# Patient Record
Sex: Female | Born: 2000 | Race: White | Hispanic: No | Marital: Single | Smoking: Current every day smoker
Health system: Southern US, Community
[De-identification: ages and names within clinical notes are randomized; demographics above are authoritative.]

## PROBLEM LIST (undated history)

## (undated) DIAGNOSIS — F419 Anxiety disorder, unspecified: Secondary | ICD-10-CM

## (undated) DIAGNOSIS — E282 Polycystic ovarian syndrome: Secondary | ICD-10-CM

## (undated) DIAGNOSIS — F32A Depression, unspecified: Secondary | ICD-10-CM

## (undated) DIAGNOSIS — G932 Benign intracranial hypertension: Secondary | ICD-10-CM

## (undated) HISTORY — PX: BRAIN SURGERY: SHX531

---

## 2019-06-05 ENCOUNTER — Emergency Department
Admission: EM | Admit: 2019-06-05 | Discharge: 2019-06-05 | Disposition: A | Discharge status: Home / Self Care | Payer: BC Managed Care – PPO | Attending: Emergency Medicine | Admitting: Emergency Medicine

## 2019-06-05 ENCOUNTER — Emergency Department: Payer: BC Managed Care – PPO

## 2019-06-05 DIAGNOSIS — T8501XA Breakdown (mechanical) of ventricular intracranial (communicating) shunt, initial encounter: Secondary | ICD-10-CM

## 2019-06-05 DIAGNOSIS — G932 Benign intracranial hypertension: Secondary | ICD-10-CM | POA: Insufficient documentation

## 2019-06-05 HISTORY — DX: Benign intracranial hypertension: G93.2

## 2019-06-05 NOTE — ED Notes (Signed)
To imaging.

## 2019-06-05 NOTE — ED Triage Notes (Signed)
Pt presents to ED c/o "vibrating shunt" x5 hours today. Pt states the shunt was placed on the R side of her head back in March due to intracranial hypertension. Pt states she would experience vibration in the past but it never lasted longer than 30 minutes. Pt states the vibration would last for 30 minutes today, stop for a few minutes and then begin vibrating again. Pt denies vibration at this time. Pt denies headache at this time. Neurologically intact.

## 2019-06-05 NOTE — ED Provider Notes (Signed)
Clinical Impression  1. Pseudotumor cerebri syndrome    2. Malfunction of cerebral ventricular shunt, initial encounter          Pt presents over concerns of poss shunt malfunction which on imaging or clinically doesn' tappear to be the case. Pt stable at this time. Reviewed results   Disposition:  ED Disposition     ED Disposition Condition Date/Time Comment    Discharge  Sun Jun 05, 2019  7:48 PM Chriss Driver [place] discharge to home/self care.    Condition at disposition: Stable            Dimple Casey  06/05/2019 7:48 PM        Chief Complaint   Patient presents with    Shunt Problem        HPI  Pt is a 18 yo with h/o pseudotumor presenting with c/o buzzing at her vp shunt motor/generator. Never really had ha's with her pseudotumor only visual changes and hasn't noted a significant change there. No vomiting, excessive sleepiness, issues with balance/coordination. No fever. No pain over site but was buzzing for far longer than normal today. Pt follows with [facility]. No recent neck/abd trauma.   History:   Past Medical History:   Diagnosis Date    Intracranial hypertension      History reviewed. No pertinent surgical history.  History reviewed. No pertinent family history.    Social History     Tobacco Use    Smoking status: Never Smoker    Smokeless tobacco: Never Used   Substance Use Topics    Alcohol use: Never     Frequency: Never    Drug use: Never       Review of Systems   Constitutional: Negative.    HENT: Negative.    Eyes: Negative for photophobia and visual disturbance.   Gastrointestinal: Negative.    Musculoskeletal: Negative.    Skin: Negative.    Neurological: Positive for headaches. Negative for dizziness, weakness, light-headedness and numbness.   Hematological: Negative.        ED Triage Vitals [06/05/19 1800]   Enc Vitals Group      BP 118/71      Heart Rate 84      Resp Rate 18      Temp 99.1 F (37.3 C)      Temp Source Skin      SpO2 100 %      Weight 86.3 kg       Height 1.626 m      Head Circumference       Peak Flow       Pain Score 0      Pain Loc       Pain Edu?       Excl. in GC?        Physical Exam  Vitals signs and nursing note reviewed.   Constitutional:       General: She is not in acute distress.     Appearance: Normal appearance. She is obese. She is not ill-appearing or toxic-appearing.   HENT:      Head: Normocephalic and atraumatic.      Mouth/Throat:      Mouth: Mucous membranes are moist.   Eyes:      Extraocular Movements: Extraocular movements intact.      Conjunctiva/sclera: Conjunctivae normal.      Pupils: Pupils are equal, round, and reactive to light.   Neck:  Musculoskeletal: Normal range of motion and neck supple. No neck rigidity or muscular tenderness.   Cardiovascular:      Rate and Rhythm: Normal rate and regular rhythm.   Skin:     General: Skin is warm.      Capillary Refill: Capillary refill takes less than 2 seconds.      Coloration: Skin is not pale.      Findings: No rash.   Neurological:      Mental Status: She is alert and oriented to person, place, and time.      Cranial Nerves: No cranial nerve deficit.      Sensory: No sensory deficit.      Motor: No weakness.      Coordination: Coordination normal.         ED Course as of Jun 05 1947   Sun Jun 05, 2019   1919 Ct head no acute findings noted     [GM]      ED Course User Index  [GM] Dimple Casey, MD       Reviewed patient's previous medical records including but not limited to [facility] previous discharge summary, [facility] previous imaging studies and [facility] previous ED visits    Reviewed nursing notes, updated vitals/assessments.       Procedures    Review previous pertinent visits and Review previous pertinent diagnostics       Dimple Casey, MD  06/05/19 1949

## 2019-08-05 ENCOUNTER — Emergency Department: Payer: BC Managed Care – PPO

## 2019-08-05 ENCOUNTER — Emergency Department
Admission: EM | Admit: 2019-08-05 | Discharge: 2019-08-05 | Disposition: A | Discharge status: Home / Self Care | Payer: BC Managed Care – PPO | Attending: Emergency Medicine | Admitting: Emergency Medicine

## 2019-08-05 DIAGNOSIS — R42 Dizziness and giddiness: Secondary | ICD-10-CM | POA: Insufficient documentation

## 2019-08-05 HISTORY — DX: Polycystic ovarian syndrome: E28.2

## 2019-08-05 LAB — CBC AND DIFFERENTIAL
Basophils %: 0.7 % (ref 0.0–3.0)
Basophils Absolute: 0 10*3/uL (ref 0.0–0.3)
Eosinophils %: 1.4 % (ref 0.0–7.0)
Eosinophils Absolute: 0.1 10*3/uL (ref 0.0–0.8)
Hematocrit: 41.4 % (ref 36.0–48.0)
Hemoglobin: 13.4 gm/dL (ref 12.0–16.0)
Lymphocytes Absolute: 2.2 10*3/uL (ref 0.6–5.1)
Lymphocytes: 31.2 % (ref 15.0–46.0)
MCH: 27 pg — ABNORMAL LOW (ref 28–35)
MCHC: 33 gm/dL (ref 32–36)
MCV: 82 fL (ref 80–100)
MPV: 7.5 fL (ref 6.0–10.0)
Monocytes Absolute: 0.3 10*3/uL (ref 0.1–1.7)
Monocytes: 4.4 % (ref 3.0–15.0)
Neutrophils %: 62.3 % (ref 42.0–78.0)
Neutrophils Absolute: 4.4 10*3/uL (ref 1.7–8.6)
PLT CT: 336 10*3/uL (ref 130–440)
RBC: 5.05 10*6/uL — ABNORMAL HIGH (ref 3.80–5.00)
RDW: 13.1 % (ref 11.0–14.0)
WBC: 7 10*3/uL (ref 4.0–11.0)

## 2019-08-05 LAB — HCG, SERUM, QUALITATIVE: BHCG Qualitative: NEGATIVE

## 2019-08-05 MED ORDER — MECLIZINE HCL 25 MG PO TABS
25.0000 mg | ORAL_TABLET | Freq: Four times a day (QID) | ORAL | 0 refills | Status: DC | PRN
Start: 2019-08-05 — End: 2021-09-27

## 2019-08-05 MED ORDER — MECLIZINE HCL 25 MG PO TABS
25.0000 mg | ORAL_TABLET | Freq: Four times a day (QID) | ORAL | 0 refills | Status: DC | PRN
Start: 2019-08-05 — End: 2019-08-05

## 2019-08-05 NOTE — ED Provider Notes (Addendum)
History     Chief Complaint   Patient presents with    Dizziness     HPI     Patient here for evaluation of dizziness.  Patient states for last several days she has had dizziness she said she sits up in bed sometimes in the room starts spinning.  Her boyfriend was happening because it felt like she was on a merry-go-round he said he did now it sort of comes and goes she is not having a right now seems to be is worse with head movement again.  States she maybe had some blurry vision.  She was concerned that she has a history of pseudotumor with recent shunt placement back in March due to failed medical therapy on Diamox.  She is otherwise had no medication changes although has been on some over-the-counter allergy medicines for the last several months as well as Topamax per her neurologist.  She currently has no headache no fevers no chills no blurry vision although states she has on and off blurry vision sometimes.  He has had no focal numbness weakness or tingling no disequilibrium otherwise in terms of walking.  She is able to text without difficulties.    Past Medical History:   Diagnosis Date    Intracranial hypertension     PCOS (polycystic ovarian syndrome)        Past Surgical History:   Procedure Laterality Date    BRAIN SURGERY      brain shunt 3/20       No family history on file.    Social  Social History     Tobacco Use    Smoking status: Never Smoker    Smokeless tobacco: Never Used   Substance Use Topics    Alcohol use: Yes     Frequency: Never     Comment: occ    Drug use: Yes     Comment: occ       .     No Known Allergies    Home Medications     Med List Status: In Progress Set By: Loreli Slot, RN at 08/05/2019  8:20 PM                escitalopram (LEXAPRO) 20 MG tablet     Take 20 mg by mouth daily     topiramate (TOPAMAX) 25 MG capsule     Take 50 mg by mouth daily           Review of Systems   Constitutional: Negative.  Negative for activity change, chills, fatigue and fever.    HENT: Negative.  Negative for congestion, sore throat, trouble swallowing and voice change.    Eyes: Negative.  Negative for photophobia, redness and visual disturbance.   Respiratory: Negative.  Negative for cough, chest tightness and shortness of breath.    Cardiovascular: Negative.  Negative for chest pain.   Gastrointestinal: Negative.  Negative for abdominal pain, diarrhea, nausea and vomiting.   Genitourinary: Negative.  Negative for dysuria and frequency.   Musculoskeletal: Negative.  Negative for back pain and neck pain.   Skin: Negative.  Negative for rash.   Neurological: Positive for dizziness. Negative for tremors, seizures, syncope, facial asymmetry, speech difficulty, weakness, light-headedness, numbness and headaches.   Hematological: Negative for adenopathy.   Psychiatric/Behavioral: Negative.  The patient is not nervous/anxious.        Physical Exam    BP: 118/74, Heart Rate: 98, Temp: 98.7 F (37.1 C), Resp Rate: 18,  SpO2: 100 %, Weight: 82.1 kg    Physical Exam  Vitals signs and nursing note reviewed.   Constitutional:       General: She is not in acute distress.     Appearance: Normal appearance. She is well-developed.   HENT:      Head: Normocephalic and atraumatic.      Right Ear: Hearing, tympanic membrane, ear canal and external ear normal.      Left Ear: Hearing, tympanic membrane, ear canal and external ear normal.      Nose: Nose normal.      Mouth/Throat:      Pharynx: Uvula midline.   Eyes:      General: Lids are normal.      Conjunctiva/sclera: Conjunctivae normal.      Pupils: Pupils are equal, round, and reactive to light.   Neck:      Musculoskeletal: Full passive range of motion without pain, normal range of motion and neck supple.      Trachea: Trachea and phonation normal.   Cardiovascular:      Rate and Rhythm: Normal rate and regular rhythm.      Heart sounds: Normal heart sounds, S1 normal and S2 normal. No murmur.   Pulmonary:      Effort: Pulmonary effort is normal.       Breath sounds: Normal breath sounds. No wheezing.   Abdominal:      General: Bowel sounds are normal. There is no distension.      Palpations: Abdomen is soft.      Tenderness: There is no abdominal tenderness. There is no guarding or rebound.   Musculoskeletal: Normal range of motion.   Skin:     General: Skin is warm and dry.      Findings: No rash.   Neurological:      Mental Status: She is alert and oriented to person, place, and time.      Cranial Nerves: No cranial nerve deficit.      Sensory: No sensory deficit.      Motor: No weakness.      Coordination: Coordination normal.      Gait: Gait normal.      Deep Tendon Reflexes: Reflexes normal.   Psychiatric:         Behavior: Behavior normal. Behavior is cooperative.           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 Pt reports she has been doing well since surgery up until 5-6 days ago when she began to feel intermittent lightheaded and dizziness.   Reports dizziness and lightheadedness has been constant and been worsening. Also reports intermittent RUQ abd pain (same stated location as end of brain shunt.)     Examination:  CT HEAD WO CONTRAST    TECHNIQUE:  5 mm helical images obtained from the skull base through the vertex without contrast.  2.5 mm reconstructed bone algorithm, and 3 mm sagittal and coronal reformatted images provided.  CT images were acquired using Automated Exposure Control for dose   reduction.  COMPARISON:   June 05, 2019    FINDINGS:     There is a right posterior approach ventriculostomy shunt catheter terminating in the region of the left frontal horn. The ventricles are diminutive in caliber,  similar to prior. There is no evidence of acute intracranial hemorrhage, extra-axial   collection, mass effect, midline shift, herniation or hydrocephalus. The gray-white differentiation is intact.   The visualized paranasal sinuses and mastoid air cells are clear.   The surrounding soft tissues and osseous structures are unremarkable.        Impression    No acute intracranial hemorrhage or mass effect.    Redemonstrated right posterior approach ventriculostomy catheter. The ventricles are diminutive in caliber, similar to prior.     ReadingStation:[email or web address]   XR Shunt Series- 2 Vw Skull, 2 Vw Chest, 2 Vw Abd    Narrative    Clinical History:  shunt malfxn    Ordering Comments:   None.      Study Notes:   Patient presents for light headedness, dizziness, abdominal pain, shunt placed in March. Evaluate shunt     Examination:  XR SHUNT SERIES- 2VW SKULL, 2VW CHEST, 2VW ABD    Comparison:  June 05, 2019    Findings:  Frontal and lateral views are obtained of the head and neck neck, the chest, and the abdomen and pelvis.    There is again a right posterior parietal ventriculoperitoneal shunt. On the frontal view, the tip of the shunt extends just to the left of the midline. There is no evidence of discontinuity of the radiodense portions of the shunt. The shunt tip is in   the deep left pelvis. The distal shunt tubing has a somewhat different configuration, and could have mild kinking in the left pelvis. There is no definite collection around the tubing.    The heart size is normal. The lungs are grossly clear. There is no definite acute abdominopelvic pathology.      Impression    The ventriculoperitoneal shunt tubing appears to be intact. There may be mild kinking of the tubing in the left pelvis.    ReadingStation:[email or web address]     Results for orders placed or performed during the hospital encounter of 08/05/19   CBC and differential   Result Value Ref Range    WBC 7.0 4.0 - 11.0 K/cmm    RBC 5.05 (H) 3.80 -  5.00 M/cmm    Hemoglobin 13.4 12.0 - 16.0 gm/dL    Hematocrit 29.5 62.1 - 48.0 %    MCV 82 80 - 100 fL    MCH 27 (L) 28 - 35 pg    MCHC 33 32 - 36 gm/dL    RDW 30.8 65.7 - 84.6 %    PLT CT 336 130 - 440 K/cmm    MPV 7.5 6.0 - 10.0 fL    Neutrophils % 62.3 42.0 - 78.0 %    Lymphocytes 31.2 15.0 - 46.0 %    Monocytes 4.4 3.0 - 15.0 %    Eosinophils % 1.4 0.0 - 7.0 %    Basophils % 0.7 0.0 - 3.0 %    Neutrophils Absolute 4.4 1.7 - 8.6 K/cmm    Lymphocytes Absolute 2.2 0.6 - 5.1 K/cmm    Monocytes Absolute 0.3 0.1 - 1.7 K/cmm    Eosinophils Absolute 0.1 0.0 - 0.8 K/cmm    Basophils Absolute 0.0 0.0 - 0.3 K/cmm   Beta HCG, Qual, Serum   Result Value Ref Range  BHCG Qualitative Negative Negative         MDM           Patient with no evidence of shunt malfunction on CT scan clinically or palpation of her shunt as well as on the shunt series.  She feels well here now.  She may have some positional vertigo.  She should go off her allergy medications we will try her on some Antivert.  Should not interact with her medications or cause any problems with her fluid production.  She otherwise feels well.  No other signs or symptoms of acute intracranial pathology such as stroke, aneurysm, bleeding, shunt malfunction, increased intercranial pressure.     This patient presents to the Emergency Department with dizziness/vertigo.  Based on my history, examination, and evaluation, several differential diagnoses including peripheral vertigo, central vertigo, dehydration, neurologic/cardiac causes for dizziness have been considered.  Serious or potentially life-threatening causes of the patient's symptoms like cardiac/neurologic causes seem unlikely. The patient seems improved, is able to ambulation and keep fluids down, is neurologically intact, and can be discharged home and managed with symptomatic care.  I advised the patient to return to the emergency department immediately should they develop syncope, neurologic symptoms, chest  pain, uncontrolled vertigo, or any acute concerns .  Diagnostic impression and plan were discussed with the patient and/or family.  If ordered, results of lab/radiology tests were reviewed and discussed with the patient and/or family. Questions were answered and concerns were addressed.  The patient was encouraged to follow-up with their primary care provider or specialist.    Procedures    Clinical Impression & Disposition     Clinical Impression  Final diagnoses:   Vertigo        ED Disposition     ED Disposition Condition Date/Time Comment    Discharge  Fri Aug 05, 2019 10:59 PM Chriss Driver [place] discharge to home/self care.    Condition at disposition: Stable             Discharge Medication List as of 08/05/2019 10:59 PM      START taking these medications    Details   meclizine (ANTIVERT) 25 MG tablet Take 1 tablet (25 mg total) by mouth every 6 (six) hours as needed for Dizziness, Starting Fri 08/05/2019, Print                       Wyona Almas, MD  08/05/19 2252       Wyona Almas, MD  08/15/19 1004

## 2019-08-05 NOTE — Consults (Signed)
Consult for SBIRT  Consult performed by: Olga Coaster, RN  Consult ordered by: Wyona Almas, MD  Assessment/Recommendations:   Kim Howe was given a brief intervention for depression during today's visit.     Patient scored in the moderate range on the PHQ9 for depression.    Tobacco Screen was Positive. Did not discuss tobacco cessation with patient during today's visit.     In discussing the issue of depression the patient was informed that she scored in the moderate range. The patient reports that she has struggled with anxiety and depression since she was a teenager. States she saw a therapist a few years ago before she moved to the area. Patient reports her mother passes away when she was younger and that her grandparents raised her. States this was not a good living situation and she moved here to "start over". Reports trouble sleeping and frequent awakening, however states this is new. Denies fatigue or trouble with concentration. Patient does report feelings of guilt and that she is a burden on others. Reports some thoughts that others would be better off if she were not here, however patient denies any suicidal intent or a plan. States she has never attempted in the past. Patient reports "I am close with my brother and I would never do that to him". Patient states she can remain safe outside of the hospital. Reports she has friends she would reach out to if these thoughts got worse. Patient states she started medication for anxiety two months ago and feels she is doing "well" in regards to her mood right now. Patient states she does not feel she needs additional support at this time however she is willing to take a list of resources. Provided patient with outpatient therapists and psychiatrists as well as SBIRT contact information. Encouraged her to reach out to SBIRT office with any questions or concerns. RN an MD aware of information gained during this consult and resources provided  to patient.     In total, 10 minutes of personal time was spent administering and interpreting the screen, plus performing a brief intervention.

## 2019-08-05 NOTE — ED Triage Notes (Signed)
Pt has a hx of brain shunt placement in March 2020 due to idiopathic intracranial HTN, causing intermittent blindness. Pt reports she has been doing well since surgery up until 5-6 days ago when she began to feel intermittent lightheaded and dizziness. Reports dizziness and lightheadedness has been constant and been worsening. Also reports intermittent RUQ abd pain (same stated location as end of brain shunt.)

## 2020-07-10 IMAGING — CT CT Brain W-O Contrast
3 series · 16 of 47 positions shown, 19 images · non-contrast
Comparison: none

CT Brain W-O Contrast
INDICATION: Shunt.                                                                       
 Pertinent History: idiopathic intracranial htn, shunt placement.                          
 Surgical History:Ventricular shunt                                                        
 Cancer: None                                                                              
 Comments: None.
TECHNIQUE: Helical acquisition with sagittal and coronal reformats.                       
 Utilized dose reduction techniques include: Vendor specific iterative                     
 reconstruction technique

[Series 303: axial 2.5mm · axial · 0.49mm/px · z∈[-15,+145]mm · 10 of 76 slices shown, 13 images]
[im 6/76  brain]
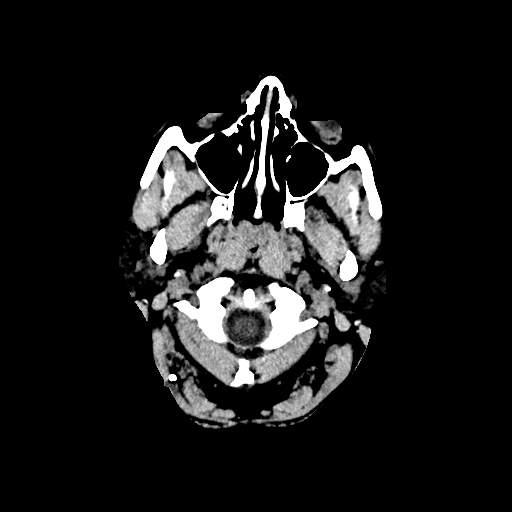
[im 6/76  bone]
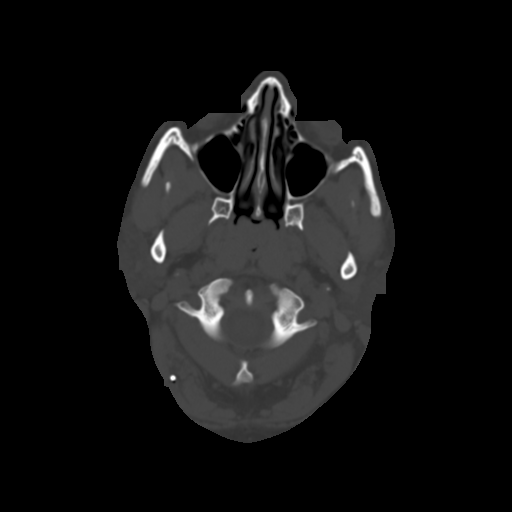
[im 13/76  brain]
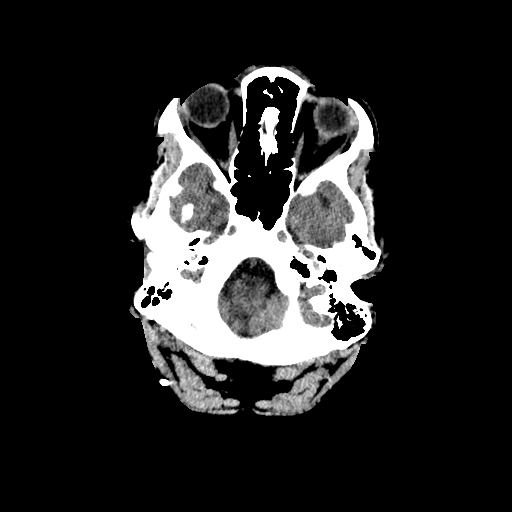
[im 21/76  brain]
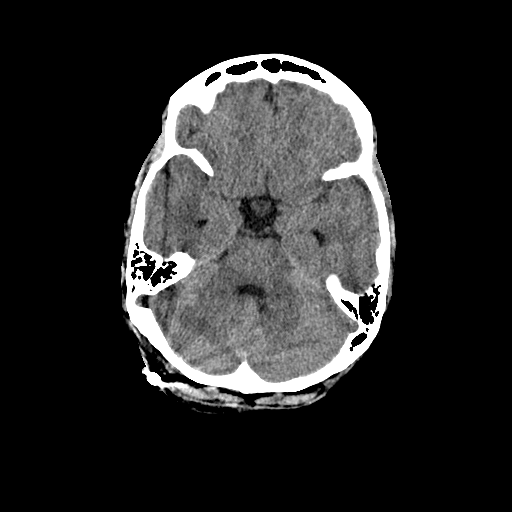
[im 26/76  brain]
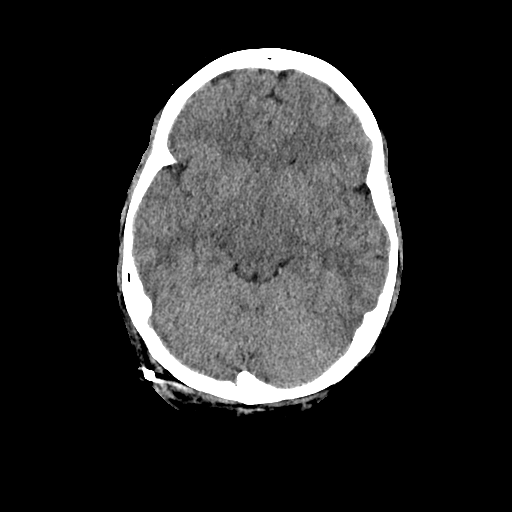
[im 34/76  brain]
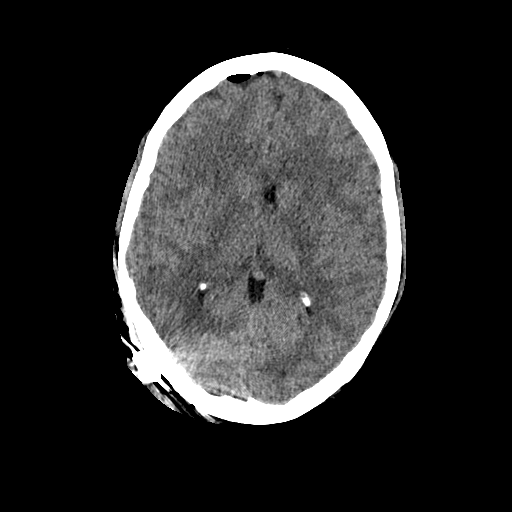
[im 34/76  bone]
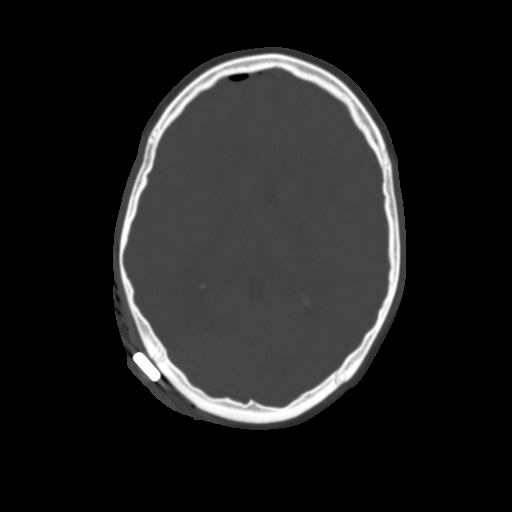
[im 42/76  brain]
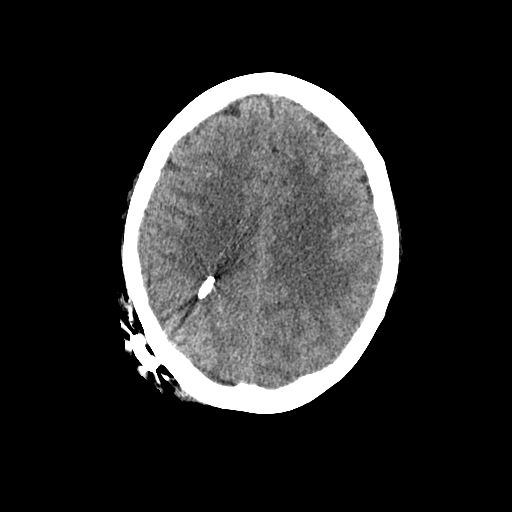
[im 50/76  brain]
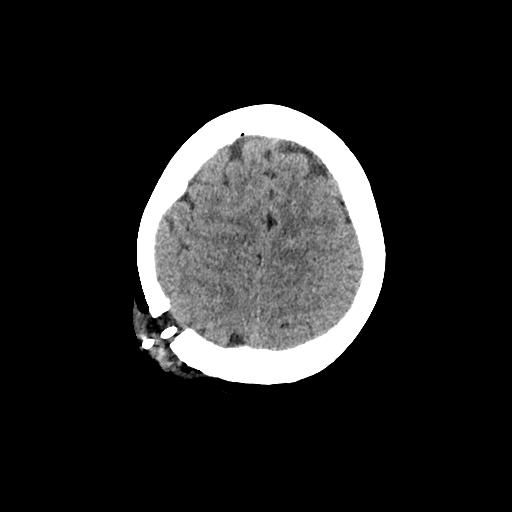
[im 57/76  brain]
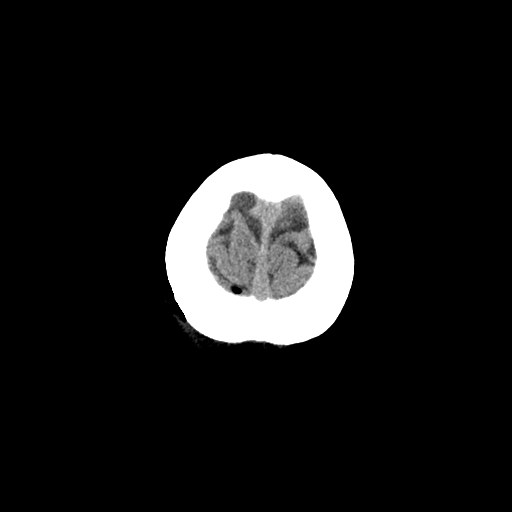
[im 63/76  brain]
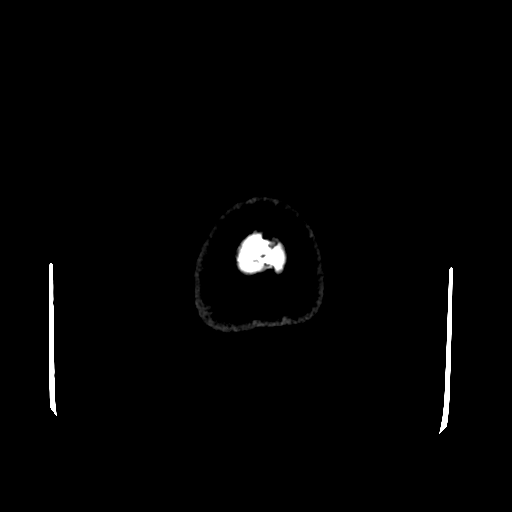
[im 63/76  bone]
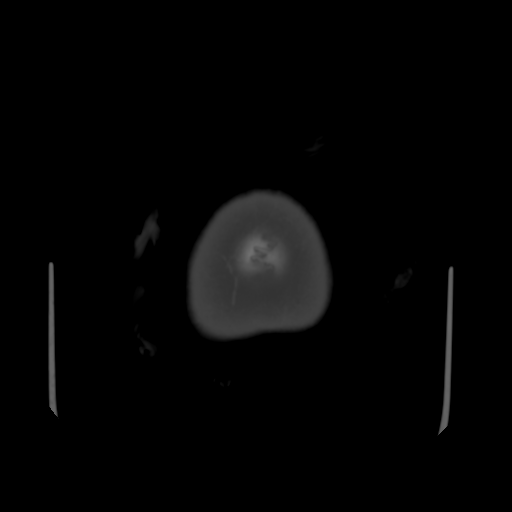
[im 70/76  brain]
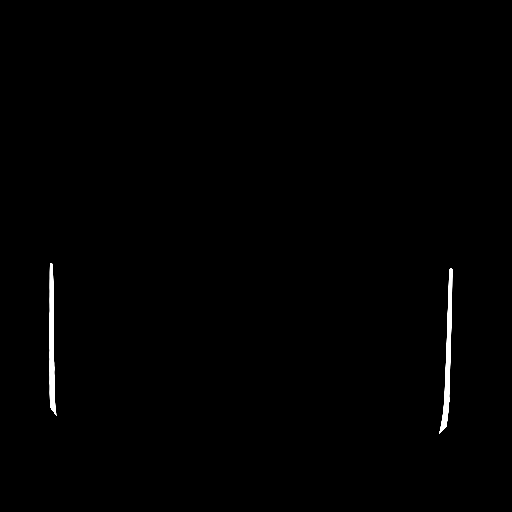

[Series 601: coronal head 3x3 · coronal · 0.49mm/px · 3 of 68 slices shown]
[im 23/68  brain]
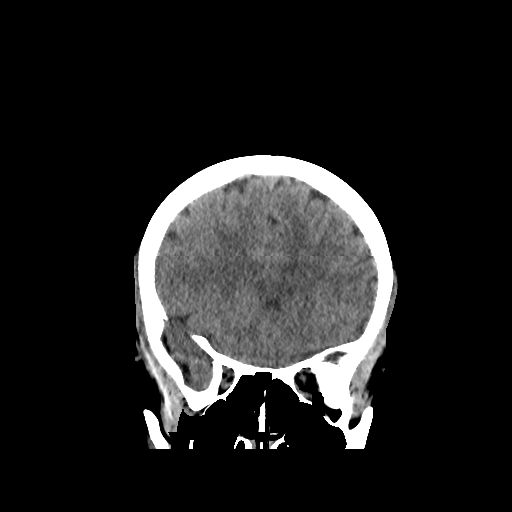
[im 30/68  brain]
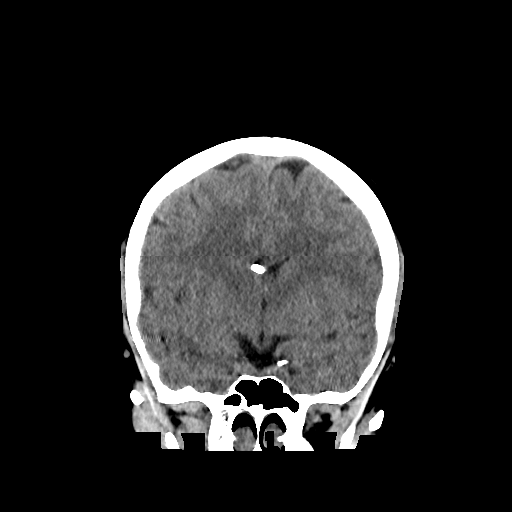
[im 38/68  brain]
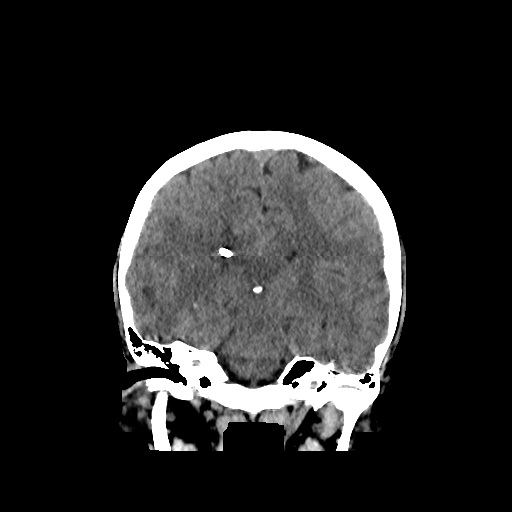

[Series 602: sagittal 3x3 · sagittal · 0.49mm/px · 3 of 62 slices shown]
[im 21/62  brain]
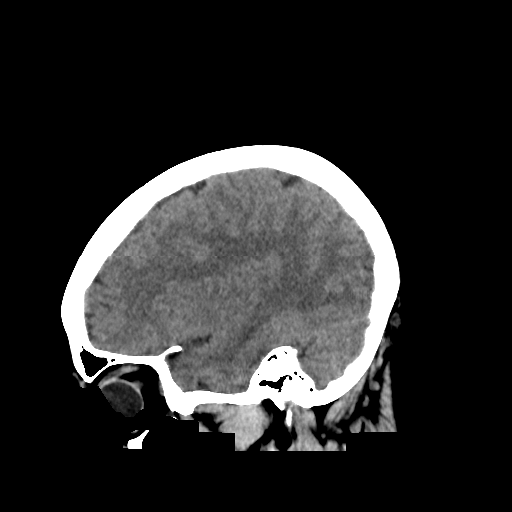
[im 31/62  brain]
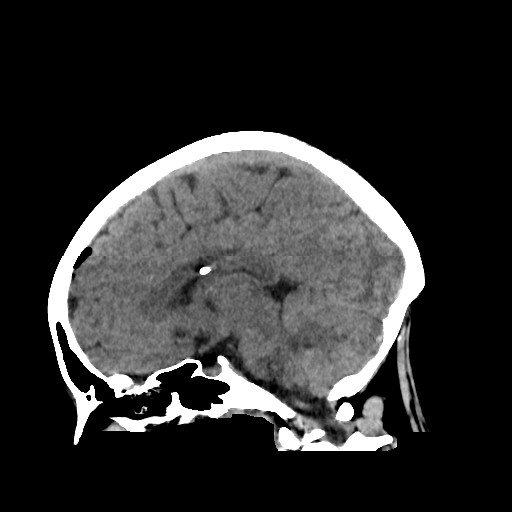
[im 41/62  brain]
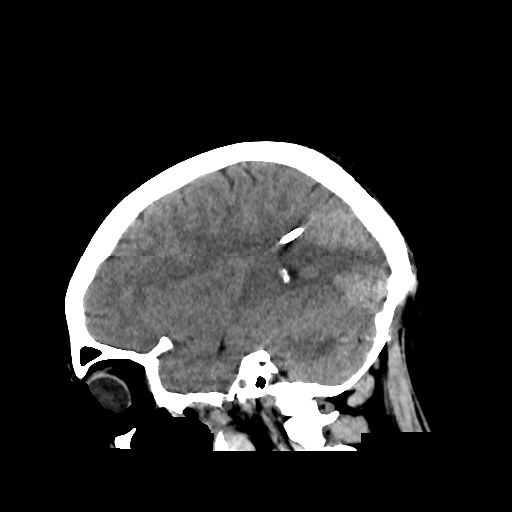

[16 of 47 positions shown; findings below may reference images not displayed]

FINDINGS: Right posterior parietal approach VP shunt present. The shot terminates along             
 the right aspect of the anterior septum pellucidum. Complete decompression of             
 the right lateral ventricle. Left lateral ventricle is slitlike. There is no              
 evidence of hydrocephalus. Mild iatrogenic pneumocephalus noted. No                       
 significant                                                                               
 intracranial hemorrhage. No midline shift or mass effect.                                 
 Hematoma and soft tissue swelling noted along the right parietal scalp, centered          
 at the level of the burr hole  and programmable shunt valve. Iatrogenic soft              
 tissue emphysema noted on the right.                                                      
 Paranasal sinuses are clear. Calvarium is otherwise intact. No acute                      
 intraorbital abnormalities.
IMPRESSION: Expected postsurgical changes consistent with recent right parietal approach VP           
 shunt placement.

## 2020-08-06 ENCOUNTER — Encounter: Payer: Self-pay | Admitting: Physician Assistant

## 2020-08-06 DIAGNOSIS — R112 Nausea with vomiting, unspecified: Secondary | ICD-10-CM

## 2020-08-23 ENCOUNTER — Ambulatory Visit
Admission: RE | Admit: 2020-08-23 | Discharge: 2020-08-23 | Disposition: A | Discharge status: Home / Self Care | Payer: BC Managed Care – PPO | Source: Ambulatory Visit | Attending: Physician Assistant | Admitting: Physician Assistant

## 2020-08-23 DIAGNOSIS — R112 Nausea with vomiting, unspecified: Secondary | ICD-10-CM | POA: Insufficient documentation

## 2021-09-20 ENCOUNTER — Emergency Department: Payer: BC Managed Care – PPO

## 2021-09-20 ENCOUNTER — Emergency Department
Admission: EM | Admit: 2021-09-20 | Discharge: 2021-09-20 | Disposition: A | Discharge status: Home / Self Care | Payer: BC Managed Care – PPO | Attending: Nurse Practitioner | Admitting: Nurse Practitioner

## 2021-09-20 DIAGNOSIS — R1084 Generalized abdominal pain: Secondary | ICD-10-CM | POA: Insufficient documentation

## 2021-09-20 DIAGNOSIS — Z3A01 Less than 8 weeks gestation of pregnancy: Secondary | ICD-10-CM | POA: Insufficient documentation

## 2021-09-20 DIAGNOSIS — O99280 Endocrine, nutritional and metabolic diseases complicating pregnancy, unspecified trimester: Secondary | ICD-10-CM | POA: Insufficient documentation

## 2021-09-20 DIAGNOSIS — F1290 Cannabis use, unspecified, uncomplicated: Secondary | ICD-10-CM | POA: Insufficient documentation

## 2021-09-20 DIAGNOSIS — E876 Hypokalemia: Secondary | ICD-10-CM | POA: Insufficient documentation

## 2021-09-20 DIAGNOSIS — O99321 Drug use complicating pregnancy, first trimester: Secondary | ICD-10-CM | POA: Insufficient documentation

## 2021-09-20 DIAGNOSIS — O26891 Other specified pregnancy related conditions, first trimester: Secondary | ICD-10-CM | POA: Insufficient documentation

## 2021-09-20 DIAGNOSIS — R112 Nausea with vomiting, unspecified: Secondary | ICD-10-CM

## 2021-09-20 DIAGNOSIS — O210 Mild hyperemesis gravidarum: Secondary | ICD-10-CM | POA: Insufficient documentation

## 2021-09-20 DIAGNOSIS — R825 Elevated urine levels of drugs, medicaments and biological substances: Secondary | ICD-10-CM | POA: Insufficient documentation

## 2021-09-20 LAB — CBC AND DIFFERENTIAL
Basophils %: 0.2 % (ref 0.0–3.0)
Basophils Absolute: 0 10*3/uL (ref 0.0–0.3)
Eosinophils %: 0.2 % (ref 0.0–7.0)
Eosinophils Absolute: 0 10*3/uL (ref 0.0–0.8)
Hematocrit: 44.1 % (ref 36.0–48.0)
Hemoglobin: 14.5 gm/dL (ref 12.0–16.0)
Lymphocytes Absolute: 1.1 10*3/uL (ref 0.6–5.1)
Lymphocytes: 12.3 % — ABNORMAL LOW (ref 15.0–46.0)
MCH: 30 pg (ref 28–35)
MCHC: 33 gm/dL (ref 32–36)
MCV: 90 fL (ref 80–100)
MPV: 7.7 fL (ref 6.0–10.0)
Monocytes Absolute: 0.3 10*3/uL (ref 0.1–1.7)
Monocytes: 3 % (ref 3.0–15.0)
Neutrophils %: 84.2 % — ABNORMAL HIGH (ref 42.0–78.0)
Neutrophils Absolute: 7.7 10*3/uL (ref 1.7–8.6)
PLT CT: 307 10*3/uL (ref 130–440)
RBC: 4.9 10*6/uL (ref 3.80–5.00)
RDW: 12.5 % (ref 11.0–14.0)
WBC: 9.1 10*3/uL (ref 4.0–11.0)

## 2021-09-20 LAB — COMPREHENSIVE METABOLIC PANEL
ALT: 11 U/L (ref 0–55)
AST (SGOT): 14 U/L (ref 10–42)
Albumin/Globulin Ratio: 1.39 Ratio (ref 0.80–2.00)
Albumin: 4.6 gm/dL (ref 3.5–5.0)
Alkaline Phosphatase: 53 U/L (ref 40–145)
Anion Gap: 16.2 mMol/L (ref 7.0–18.0)
BUN / Creatinine Ratio: 10.4 Ratio (ref 10.0–30.0)
BUN: 7 mg/dL (ref 7–22)
Bilirubin, Total: 0.7 mg/dL (ref 0.1–1.2)
CO2: 20 mMol/L (ref 20–30)
Calcium: 9.7 mg/dL (ref 8.5–10.5)
Chloride: 104 mMol/L (ref 98–110)
Creatinine: 0.67 mg/dL (ref 0.60–1.20)
EGFR: 127 mL/min/{1.73_m2} (ref 60–150)
Globulin: 3.3 gm/dL (ref 2.0–4.0)
Glucose: 89 mg/dL (ref 71–99)
Osmolality Calculated: 271 mOsm/kg — ABNORMAL LOW (ref 275–300)
Potassium: 3.2 mMol/L — ABNORMAL LOW (ref 3.5–5.3)
Protein, Total: 7.9 gm/dL (ref 6.0–8.3)
Sodium: 137 mMol/L (ref 136–147)

## 2021-09-20 LAB — VH URINALYSIS WITH MICROSCOPIC AND CULTURE IF INDICATED
Bilirubin, UA: NEGATIVE
Glucose, UA: NEGATIVE mg/dL
Ketones UA: 80 mg/dL — AB
Leukocyte Esterase, UA: NEGATIVE Leu/uL
Nitrite, UA: NEGATIVE
Protein, UR: NEGATIVE mg/dL
RBC, UA: 2 /hpf (ref 0–5)
Squam Epithel, UA: 1 /hpf (ref 0–2)
Urine Specific Gravity: 1.01 (ref 1.001–1.040)
Urobilinogen, UA: NORMAL mg/dL
WBC, UA: <1 /hpf (ref 0–4)
pH, Urine: 5 pH (ref 5.0–8.0)

## 2021-09-20 LAB — VH URINE DRUG SCREEN - NO CONFIRMATION
Propoxyphene: NEGATIVE
Urine Amphetamine: NEGATIVE
Urine Barbiturates: NEGATIVE
Urine Benzodiazepines: NEGATIVE
Urine Buprenorphine: NEGATIVE
Urine Cannabinoids: POSITIVE — AB
Urine Cocaine: NEGATIVE
Urine Creatinine Random: 72 mg/dL
Urine Ecstasy Screen: NEGATIVE
Urine Fentanyl Screen: NEGATIVE
Urine Methadone Screen: NEGATIVE
Urine Opiates: POSITIVE — AB
Urine Oxycodone: NEGATIVE
Urine Phencyclidine: NEGATIVE
Urine Specific Gravity: 1.009 (ref 1.001–1.040)
pH, Urine: 5.6 pH (ref 5.0–8.0)

## 2021-09-20 LAB — HCG, SERUM, QUALITATIVE: BHCG Qualitative: POSITIVE — AB

## 2021-09-20 LAB — LIPASE: Lipase: 11 U/L (ref 8–78)

## 2021-09-20 LAB — VH EXTRA SPECIMEN LABEL

## 2021-09-20 LAB — HCG QUANTITATIVE: BHCG Quant.: 30295.5 m[IU]/mL

## 2021-09-20 MED ORDER — VH SODIUM CHLORIDE 0.9 % IV BOLUS
1000.00 mL | Freq: Once | INTRAVENOUS | Status: AC
Start: 2021-09-20 — End: 2021-09-20
  Administered 2021-09-20: 1000 mL via INTRAVENOUS

## 2021-09-20 MED ORDER — ONDANSETRON 4 MG PO TBDP
4.0000 mg | ORAL_TABLET | Freq: Three times a day (TID) | ORAL | 0 refills | Status: AC | PRN
Start: 2021-09-20 — End: 2021-09-23

## 2021-09-20 MED ORDER — ONDANSETRON HCL 4 MG/2ML IJ SOLN
4.00 mg | Freq: Once | INTRAMUSCULAR | Status: AC
Start: 2021-09-20 — End: 2021-09-20
  Administered 2021-09-20: 4 mg via INTRAVENOUS

## 2021-09-20 MED ORDER — POTASSIUM CHLORIDE CRYS ER 20 MEQ PO TBCR
20.0000 meq | EXTENDED_RELEASE_TABLET | Freq: Two times a day (BID) | ORAL | 0 refills | Status: DC
Start: 2021-09-20 — End: 2021-09-27

## 2021-09-20 MED ORDER — ONDANSETRON HCL 4 MG/2ML IJ SOLN
INTRAMUSCULAR | Status: AC
Start: 2021-09-20 — End: ?
  Filled 2021-09-20: qty 2

## 2021-09-20 MED ORDER — MORPHINE SULFATE 4 MG/ML IJ/IV SOLN (WRAP)
Status: AC
Start: 2021-09-20 — End: ?
  Filled 2021-09-20: qty 1

## 2021-09-20 MED ORDER — MORPHINE SULFATE 4 MG/ML IJ/IV SOLN (WRAP)
4.0000 mg | Freq: Once | Status: AC
Start: 2021-09-20 — End: 2021-09-20
  Administered 2021-09-20: 4 mg via INTRAVENOUS

## 2021-09-20 NOTE — ED Provider Notes (Signed)
[facility]  EMERGENCY DEPARTMENT  History and Physical Exam     Patient Name: Kim Howe, Kim Howe  Encounter Date:  09/20/2021  Supervising Physician: Dow Adolph, MD   Treating Provider: Cathi Roan NP  Room:  RAUC/RAU-C  Patient DOB:  2000/12/18  Age: 20 y.o. female  MRN:  [id number]  PCP: Pcp, None, MD      Diagnosis/Disposition:  MDM:     Final Impression  1. Nausea and vomiting, unspecified vomiting type    2. Less than [redacted] weeks gestation of pregnancy    3. Hypokalemia    4. Marijuana use    5. Positive urine drug screen        Disposition  ED Disposition       ED Disposition   Discharge    Condition   --    Date/Time   Fri Sep 20, 2021  2:52 PM    Comment   Tanaisha Pittman Wilczynski discharge to home/self care.    Condition at disposition: Stable                 Follow up  [facility]  [address]  [place] [address]  [phone]    As needed, If symptoms worsen fever, abdominal pain tenderness    [facility]  [address], [address]  [place] [address]  [phone]        Prescriptions  Discharge Medication List as of 09/20/2021  3:34 PM        START taking these medications    Details   ondansetron (ZOFRAN-ODT) 4 MG disintegrating tablet Take 1 tablet (4 mg) by mouth every 8 (eight) hours as needed for Nausea, Starting Fri 09/20/2021, Until Mon 09/23/2021 at 2359, E-Rx      potassium chloride (KLOR-CON) 20 MEQ tablet Take 1 tablet (20 mEq) by mouth 2 (two) times daily for 5 days, Starting Fri 09/20/2021, Until Wed 09/25/2021, E-Rx           ED Summary:    Differential diagnosis includes but is not limited to gastroenteritis, cannabis hyperemesis syndrome, appendecitis (early),     Patient overall very well-appearing with nausea and vomiting since Wednesday.  Patient did not relay concern for pregnancy and was given morphine Zofran and fluid.  This did significantly improve her symptoms she feels well and is tolerating p.o. fluids prior to  discharge.    She has a benign abdomen initially and again at the time of discharge with global discomfort but endorses no significant tenderness.  We discussed considered differential and possible differential that could appear given the patient's presentation and what symptoms including abdominal tenderness were occur.  I asked her if she had any remaining concerns please would like further work-up however she declined she feels like she is ready to go home.  These would be more challenging to work-up given her pregnancy status but ultrasounds etc. could be obtained.    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  However given the presentation with abdominal cramping, similar GI bug going through the local population and her chronic cannabis use I think this is unlikely to be increased intracranial pressure although discussed and considered.  I think head CT not indicated at this time.    Possibly hyperemesis cannabinoid syndrome.  Mild    Ketones on urine commensurate with vomiting and not eating.  Small amount of blood, should be rechecked when well.  Pregnancy was positive beta-hCG was 30,000 ultrasound unremarkable  with 6-week 3-day gestation.  UDS with cannabis and opiates, advised cessation.  CBC grossly unremarkable CMP with hypokalemia patient is given replenishment as outpatient prescription.Marland Kitchen    Discharge Zofran risk-benefit discussed including but not limited to cleft palate.  Advised to use for severe nausea only or preferably vomiting only.    Case and plan discussed briefly with attending who is the provider in triage.       At the time of disposition, after discussion with the pt of all findings, considered differential and their implications, as well as signs of worsening, and their potential implications, patient/family verbalizes understanding and is amenable to treatment plan, and has no concerns at this time, all questions answered.      The patient's past medical records,  including those in Care Everywhere when necessary, were reviewed by me    The results of diagnostic studies have been reviewed by myself. Available past medical, family, social, and surgical histories have been reviewed by myself. The clinical impression and plan have been discussed with the patient and/or the patient's family. All questions have been answered.      History of Presenting Illness:     Nurse Triage: c/o diffuse abd pain for 3 days with vomiting bile, stated she has a shunt that drains on her abd thus she is concern. no fever    Chief complaint: Abdominal Pain        HPI/ROS given by: patient, unless otherwise stated    Kim Howe is a 20 y.o. female presenting in no acute distress with a chief complaint of nausea and vomiting that began on Wednesday. Pt reports that she has not been able to keep any solids and minimal liquids down. Pt reports a sensation of diffuse lower abdominal "squeezing." She has taken something OTC to settle her stomach (not sure what it was) and she threw it up. She has also tried Nyquil to be able to sleep and has thrown that up, too. She reports no sick contacts. Pt reports smoking marijuana daily for the past five years.    Pt has a VP shunt for excessive CSF fluid.        Review of Systems:  Physical Exam:     Review of Systems   Constitutional:  Negative for chills and fever.   HENT:  Negative for congestion and sore throat.    Eyes:  Negative for visual disturbance.   Respiratory:  Negative for cough and shortness of breath.    Cardiovascular:  Negative for chest pain.   Gastrointestinal:  Positive for abdominal pain and nausea. Negative for diarrhea and vomiting.   Genitourinary:  Negative for dysuria.   Musculoskeletal:  Negative for myalgias.   Skin:  Negative for rash.   Neurological:  Negative for headaches.   Blood pressure 96/69, pulse 70, temperature 97.2 F (36.2 C), temperature source Temporal, resp. rate 18, weight 72.6 kg, SpO2 98 %.      Physical Exam  Constitutional:       General: She is not in acute distress.     Appearance: She is well-developed.   HENT:      Head: Normocephalic and atraumatic.   Eyes:      General:         Right eye: No discharge.         Left eye: No discharge.      Pupils: Pupils are equal, round, and reactive to light.   Cardiovascular:      Rate and  Rhythm: Normal rate and regular rhythm.      Pulses: Normal pulses.      Heart sounds: Normal heart sounds. No murmur heard.    No friction rub. No gallop.   Pulmonary:      Effort: Pulmonary effort is normal. No accessory muscle usage.      Breath sounds: Normal breath sounds.   Abdominal:      Palpations: Abdomen is soft. There is no mass.      Tenderness: There is no abdominal tenderness.   Musculoskeletal:         General: No deformity. Normal range of motion.      Cervical back: Normal range of motion.   Skin:     General: Skin is warm and dry.      Findings: No rash.   Neurological:      Mental Status: She is alert and oriented to person, place, and time.           Past History:     Medical: Pt has a past medical history of Intracranial hypertension and PCOS (polycystic ovarian syndrome).    Surgical: Pt  has a past surgical history that includes Brain surgery.    Family: The family history is not on file.    Social: Pt reports that she has never smoked. She has never used smokeless tobacco. She reports current alcohol use. She reports current drug use.       Diagnostic Results:     RADIOLOGIC STUDIES    All images have been personally viewed by me    US OB < 14 Weeks    Result Date: 09/20/2021  Unremarkable 6 week 3 day intrauterine pregnancy. ReadingStation:[id number]     LAB STUDIES    All lab value have been personally reviewed by me    Results       Procedure Component Value Units Date/Time    Beta HCG Quantitative [[id number]] Collected: 09/20/21 1055    Specimen: Plasma Updated: 09/20/21 1337     BHCG Quant. 30,295.5 mIU/mL     Urinalysis w Microscopic and Culture if  Indicated [[id number]]  (Abnormal) Collected: 09/20/21 1202    Specimen: Urine, Random Updated: 09/20/21 1314     Color, UA Yellow     Clarity, UA Clear     Urine Specific Gravity 1.010     pH, Urine 5.0 pH      Protein, UR Negative mg/dL      Glucose, UA Negative mg/dL      Ketones UA 80 mg/dL      Bilirubin, UA Negative     Blood, UA Small     Nitrite, UA Negative     Urobilinogen, UA Normal mg/dL      Leukocyte Esterase, UA Negative Leu/uL      UR Micro Performed     WBC, UA <1 /hpf      RBC, UA 2 /hpf      Squam Epithel, UA 1 /hpf     Urine Drug Screen - No Confirmation [[id number]]  (Abnormal) Collected: 09/20/21 1202    Specimen: Urine, Random Updated: 09/20/21 1309     Urine Creatinine Random 72 mg/dL      pH, Urine 5.6 pH      Urine Specific Gravity 1.009     Urine Amphetamine Negative     Urine Barbiturates Negative     Urine Benzodiazepines Negative     Urine Cannabinoids Presumptive Positive     Urine Cocaine Negative  Urine Methadone Screen Negative     Urine Opiates Presumptive Positive     Urine Oxycodone Negative     Urine Phencyclidine Negative     Propoxyphene Negative     Urine Buprenorphine Negative     Urine Ecstasy Screen Negative     Urine Fentanyl Screen Negative    Narrative:      This is a screening test only.  Any presumptive positive result should be confirmed by more definitive methodologies. Any unconfirmed results should be used for medical purposes only.  Samples with creatinine concentrations <20 mg/dl are considered suspect and resubmission is recommended.  The following thresholds are the minimal levels for detection:  Amphetamine    1000 ng/mL  Methadone     300 ng/mL  Barbiturate     200 ng/mL  Opiate        300 ng/mL  Benzodiazepine  200 ng/mL  Oxycodone     100 ng/mL  Cannabinoid      50 ng/mL  Phenyclidine   25 ng/mL  Cocaine         300 ng/mL  Propoxyphene  300 ng/mL  Buprenorphine     5 ng/mL  Ecstasy       500 ng/mL  Fentanyl          2 ng/mL    Lipase [[id number]]  Collected: 09/20/21 1055    Specimen: Plasma Updated: 09/20/21 1204     Lipase 11 U/L     Comprehensive metabolic panel [[id number]]  (Abnormal) Collected: 09/20/21 1055    Specimen: Plasma Updated: 09/20/21 1204     Sodium 137 mMol/L      Potassium 3.2 mMol/L      Chloride 104 mMol/L      CO2 20 mMol/L      Calcium 9.7 mg/dL      Glucose 89 mg/dL      Creatinine 7.82 mg/dL      BUN 7 mg/dL      Protein, Total 7.9 gm/dL      Albumin 4.6 gm/dL      Alkaline Phosphatase 53 U/L      ALT 11 U/L      AST (SGOT) 14 U/L      Bilirubin, Total 0.7 mg/dL      Albumin/Globulin Ratio 1.39 Ratio      Anion Gap 16.2 mMol/L      BUN / Creatinine Ratio 10.4 Ratio      EGFR 127 mL/min/1.54m2      Osmolality Calculated 271 mOsm/kg      Globulin 3.3 gm/dL     Beta HCG, Qual, Serum [[id number]]  (Abnormal) Collected: 09/20/21 1055    Specimen: Plasma Updated: 09/20/21 1204     BHCG Qualitative Positive    CBC and differential [[id number]]  (Abnormal) Collected: 09/20/21 1055    Specimen: Blood Updated: 09/20/21 1136     WBC 9.1 K/cmm      RBC 4.90 M/cmm      Hemoglobin 14.5 gm/dL      Hematocrit 52.8 %      MCV 90 fL      MCH 30 pg      MCHC 33 gm/dL      RDW 41.3 %      PLT CT 307 K/cmm      MPV 7.7 fL      Neutrophils % 84.2 %      Lymphocytes 12.3 %      Monocytes 3.0 %      Eosinophils %  0.2 %      Basophils % 0.2 %      Neutrophils Absolute 7.7 K/cmm      Lymphocytes Absolute 1.1 K/cmm      Monocytes Absolute 0.3 K/cmm      Eosinophils Absolute 0.0 K/cmm      Basophils Absolute 0.0 K/cmm     Collect Blood [[id number]] Collected: 09/20/21 1055    Specimen: Other Updated: 09/20/21 1123     Collect Blood Label Notification                EKG:    Reviewed at the time of assessment, or that it was made available to me.   EKG: Last EKG Result       None          All EKG reviewed by an attending immediately after completion per ED policy.     PROCEDURES          ORDERS PLACED THIS VISIT     Orders  Orders Placed This Encounter   Procedures     Collect Blood    CBC and differential    Comprehensive metabolic panel    Beta HCG, Qual, Serum    Lipase    Urine Drug Screen - No Confirmation    Urinalysis w Microscopic and Culture if Indicated    Beta HCG Quantitative    Saline lock IV    US OB < 14 Weeks       Medications  Medications   sodium chloride 0.9 % bolus 1,000 mL (0 mLs Intravenous Stopped 09/20/21 1254)   ondansetron (ZOFRAN) injection 4 mg (4 mg Intravenous Given 09/20/21 1140)   morphine injection 4 mg (4 mg Intravenous Given 09/20/21 1140)              Allergies & Medications:     Pt has No Known Allergies.    Discharge Medication List as of 09/20/2021  3:34 PM        CONTINUE these medications which have NOT CHANGED    Details   escitalopram (LEXAPRO) 20 MG tablet Take 20 mg by mouth daily, Historical Med      meclizine (ANTIVERT) 25 MG tablet Take 1 tablet (25 mg total) by mouth every 6 (six) hours as needed for Dizziness, Starting Fri 08/05/2019, E-Rx      topiramate (TOPAMAX) 25 MG capsule Take 50 mg by mouth daily, Historical Med                 ATTESTATIONS     Josh Natalea Sutliff, NP    The results of diagnostic studies have been reviewed by myself. The above past medical, family, social, and surgical histories have been reviewed by myself. The clinical impression and plan have been discussed with the patient and/or the patient's family. All questions have been answered.    Note:  This chart was generated by an EMR and may contain errors, including typographical, or omissions not intended by the user. This chart was generated by the Epic EMR system/speech recognition and may contain inherent errors or omissions not intended by the user. Grammatical errors, random word insertions, deletions, pronoun errors and incomplete sentences are occasional consequences of this technology due to software limitations. Not all errors are caught or corrected. If there are questions or concerns about the content of this note or information contained within the body of  this dictation they should be addressed directly with the author for clarification  Cleotis Nipper, NP  09/20/21 1658       Dow Adolph, MD  09/20/21 1723

## 2021-09-20 NOTE — ED Triage Notes (Addendum)
Pt here for n/v x3 days w/ chills. Denies abd pain but notes a squeezing sensation around her stomach. Hx of V/P shunt placement 3 years ago. Vomiting 10+ times per day.

## 2021-09-25 ENCOUNTER — Observation Stay
Admission: EM | Admit: 2021-09-25 | Discharge: 2021-09-27 | Disposition: A | Discharge status: Home / Self Care | Payer: BC Managed Care – PPO | Attending: Internal Medicine | Admitting: Internal Medicine

## 2021-09-25 DIAGNOSIS — E876 Hypokalemia: Secondary | ICD-10-CM

## 2021-09-25 DIAGNOSIS — O211 Hyperemesis gravidarum with metabolic disturbance: Principal | ICD-10-CM | POA: Insufficient documentation

## 2021-09-25 DIAGNOSIS — Z79899 Other long term (current) drug therapy: Secondary | ICD-10-CM

## 2021-09-25 DIAGNOSIS — O210 Mild hyperemesis gravidarum: Secondary | ICD-10-CM | POA: Diagnosis present

## 2021-09-25 DIAGNOSIS — O99321 Drug use complicating pregnancy, first trimester: Secondary | ICD-10-CM | POA: Insufficient documentation

## 2021-09-25 DIAGNOSIS — O99611 Diseases of the digestive system complicating pregnancy, first trimester: Secondary | ICD-10-CM

## 2021-09-25 DIAGNOSIS — Z3A01 Less than 8 weeks gestation of pregnancy: Secondary | ICD-10-CM | POA: Insufficient documentation

## 2021-09-25 DIAGNOSIS — Z982 Presence of cerebrospinal fluid drainage device: Secondary | ICD-10-CM | POA: Insufficient documentation

## 2021-09-25 DIAGNOSIS — Z7901 Long term (current) use of anticoagulants: Secondary | ICD-10-CM

## 2021-09-25 DIAGNOSIS — O26891 Other specified pregnancy related conditions, first trimester: Secondary | ICD-10-CM | POA: Insufficient documentation

## 2021-09-25 DIAGNOSIS — F1290 Cannabis use, unspecified, uncomplicated: Secondary | ICD-10-CM | POA: Insufficient documentation

## 2021-09-25 DIAGNOSIS — E282 Polycystic ovarian syndrome: Secondary | ICD-10-CM | POA: Insufficient documentation

## 2021-09-25 DIAGNOSIS — Z8669 Personal history of other diseases of the nervous system and sense organs: Secondary | ICD-10-CM

## 2021-09-25 DIAGNOSIS — G932 Benign intracranial hypertension: Secondary | ICD-10-CM | POA: Insufficient documentation

## 2021-09-25 DIAGNOSIS — O99281 Endocrine, nutritional and metabolic diseases complicating pregnancy, first trimester: Secondary | ICD-10-CM | POA: Insufficient documentation

## 2021-09-25 HISTORY — DX: Anxiety disorder, unspecified: F41.9

## 2021-09-25 HISTORY — DX: Depression, unspecified: F32.A

## 2021-09-25 LAB — BASIC METABOLIC PANEL
Anion Gap: 19.6 mMol/L — ABNORMAL HIGH (ref 7.0–18.0)
BUN / Creatinine Ratio: 10.7 Ratio (ref 10.0–30.0)
BUN: 8 mg/dL (ref 7–22)
CO2: 23 mMol/L (ref 20–30)
Calcium: 10.1 mg/dL (ref 8.5–10.5)
Chloride: 100 mMol/L (ref 98–110)
Creatinine: 0.75 mg/dL (ref 0.60–1.20)
EGFR: 116 mL/min/{1.73_m2} (ref 60–150)
Glucose: 89 mg/dL (ref 71–99)
Osmolality Calculated: 277 mOsm/kg (ref 275–300)
Potassium: 2.6 mMol/L — CL (ref 3.5–5.3)
Sodium: 140 mMol/L (ref 136–147)

## 2021-09-25 LAB — CBC AND DIFFERENTIAL
Basophils %: 0.5 % (ref 0.0–3.0)
Basophils Absolute: 0 10*3/uL (ref 0.0–0.3)
Eosinophils %: 0.3 % (ref 0.0–7.0)
Eosinophils Absolute: 0 10*3/uL (ref 0.0–0.8)
Hematocrit: 47 % (ref 36.0–48.0)
Hemoglobin: 15.7 gm/dL (ref 12.0–16.0)
Lymphocytes Absolute: 2.1 10*3/uL (ref 0.6–5.1)
Lymphocytes: 28 % (ref 15.0–46.0)
MCH: 29 pg (ref 28–35)
MCHC: 33 gm/dL (ref 32–36)
MCV: 88 fL (ref 80–100)
MPV: 7.3 fL (ref 6.0–10.0)
Monocytes Absolute: 0.7 10*3/uL (ref 0.1–1.7)
Monocytes: 9.2 % (ref 3.0–15.0)
Neutrophils %: 62 % (ref 42.0–78.0)
Neutrophils Absolute: 4.6 10*3/uL (ref 1.7–8.6)
PLT CT: 349 10*3/uL (ref 130–440)
RBC: 5.34 10*6/uL — ABNORMAL HIGH (ref 3.80–5.00)
RDW: 12.4 % (ref 11.0–14.0)
WBC: 7.5 10*3/uL (ref 4.0–11.0)

## 2021-09-25 LAB — HEPATIC FUNCTION PANEL
ALT: 12 U/L (ref 0–55)
AST (SGOT): 17 U/L (ref 10–42)
Albumin/Globulin Ratio: 1.26 Ratio (ref 0.80–2.00)
Albumin: 4.9 gm/dL (ref 3.5–5.0)
Alkaline Phosphatase: 64 U/L (ref 40–145)
Bilirubin Direct: 0.3 mg/dL (ref 0.0–0.3)
Bilirubin, Total: 0.8 mg/dL (ref 0.1–1.2)
Globulin: 3.9 gm/dL (ref 2.0–4.0)
Protein, Total: 8.8 gm/dL — ABNORMAL HIGH (ref 6.0–8.3)

## 2021-09-25 LAB — PT/INR
PT INR: 1 (ref 0.9–1.1)
PT: 10.9 s (ref 9.4–11.5)

## 2021-09-25 LAB — LIPASE: Lipase: 14 U/L (ref 8–78)

## 2021-09-25 LAB — VH EXTRA SPECIMEN LABEL

## 2021-09-25 LAB — MAGNESIUM: Magnesium: 1.9 mg/dL (ref 1.6–2.6)

## 2021-09-25 LAB — HCG QUANTITATIVE: BHCG Quant.: 79558 m[IU]/mL

## 2021-09-25 MED ORDER — MORPHINE SULFATE 4 MG/ML IJ/IV SOLN (WRAP)
1.0000 mg | Status: DC | PRN
Start: 2021-09-25 — End: 2021-09-26

## 2021-09-25 MED ORDER — ESCITALOPRAM OXALATE 10 MG PO TABS
20.0000 mg | ORAL_TABLET | Freq: Every day | ORAL | Status: DC
Start: 2021-09-26 — End: 2021-09-25

## 2021-09-25 MED ORDER — TOPIRAMATE 25 MG PO CPSP
50.0000 mg | ORAL_CAPSULE | Freq: Every day | ORAL | Status: DC
Start: 2021-09-26 — End: 2021-09-25

## 2021-09-25 MED ORDER — METOCLOPRAMIDE HCL 5 MG/ML IJ SOLN
5.0000 mg | Freq: Once | INTRAMUSCULAR | Status: AC
Start: 2021-09-25 — End: 2021-09-25
  Administered 2021-09-25: 5 mg via INTRAVENOUS

## 2021-09-25 MED ORDER — POTASSIUM CHLORIDE IN NACL 20-0.9 MEQ/L-% IV SOLN
INTRAVENOUS | Status: DC
Start: 2021-09-25 — End: 2021-09-26
  Filled 2021-09-25 (×3): qty 1000

## 2021-09-25 MED ORDER — VH HEPARIN SODIUM (PORCINE) 5000 UNIT/ML IJ SOLN
5000.0000 [IU] | Freq: Three times a day (TID) | INTRAMUSCULAR | Status: DC
Start: 2021-09-25 — End: 2021-09-26

## 2021-09-25 MED ORDER — VH SODIUM CHLORIDE 0.9 % IV BOLUS
1000.0000 mL | Freq: Once | INTRAVENOUS | Status: AC
Start: 2021-09-25 — End: 2021-09-25
  Administered 2021-09-25: 1000 mL via INTRAVENOUS

## 2021-09-25 MED ORDER — ONDANSETRON HCL 4 MG/2ML IJ SOLN
4.0000 mg | Freq: Three times a day (TID) | INTRAMUSCULAR | Status: DC | PRN
Start: 2021-09-25 — End: 2021-09-27
  Administered 2021-09-26: 10:00:00 4 mg via INTRAVENOUS
  Filled 2021-09-25: qty 2

## 2021-09-25 MED ORDER — METOCLOPRAMIDE HCL 5 MG/ML IJ SOLN
INTRAMUSCULAR | Status: AC
Start: 2021-09-25 — End: ?
  Filled 2021-09-25: qty 2

## 2021-09-25 MED ORDER — OXYCODONE-ACETAMINOPHEN 5-325 MG PO TABS
1.0000 | ORAL_TABLET | ORAL | Status: DC | PRN
Start: 2021-09-25 — End: 2021-09-26

## 2021-09-25 MED ORDER — SODIUM CHLORIDE 0.9 % IV SOLN
INTRAVENOUS | Status: AC
Start: 2021-09-25 — End: 2021-09-26

## 2021-09-25 NOTE — ED Triage Notes (Signed)
Patients presents to ER due to N/V since Friday. Patient states she has not been able to keep anything down since then. Patient states when she vomited earlier today it was dark and stringy. Patients OB recommended coming to ER.

## 2021-09-25 NOTE — H&P (Addendum)
Medicine History & Physical   [facility]  [employer]   Patient Name: Kim Howe,Kim Howe LOS: 0 days   Attending Physician: Fabian Sharp, MD PCP: Marisa Sprinkles, MD      Assessment and Plan:                                                                Date of service 09/25/2021    Hyperemesis gravidarum (7 weeks)  Labs per my review  -CBC grossly unremarkable  -BMP shows potassium 2.6  Plan  -IV fluids supportive care  -Zofran as needed  -OB/GYN consulted Dr. Delton See, follow-up recommendations  -Urine drug screen    Hypokalemia  -Potassium 2.6  -Will replenish and repeat lab    [redacted] weeks pregnant  -Has not had any prenatal care  -OB/GYN consulted    Idiopathic intracranial hypertension status post shunt  -Monitor for now    DVT PPx: Heparin  Dispo: Observation  Healthcare Proxy: Mother  Code: full code     History of Presenting Illness                                CC: Nausea vomiting intractable  Kim Howe is a 20 y.o. female patient with past medical history of mood disorder, idiopathic intracranial hypertension status post shunt presents to the ED from home with chief complaint of nausea vomiting.  Of note patient is [redacted] weeks pregnant.  Of note patient was recently in the ED 09/20/2021 for similar presentation and was diagnosed with possible cannabis related nausea vomiting.  Patient was known to be pregnant then as well.  Denies any chest pain.  Significant other bedside during entire interview.  Past Medical History:   Diagnosis Date    Intracranial hypertension     PCOS (polycystic ovarian syndrome)      Past Surgical History:   Procedure Laterality Date    BRAIN SURGERY      brain shunt 3/20     FH reviewed and found negative for Heart disease     History reviewed. No pertinent family history.  Social History     Tobacco Use    Smoking status: Never    Smokeless tobacco: Never   Vaping Use    Vaping Use: Never used   Substance Use Topics    Alcohol use: Yes      Comment: occ    Drug use: Yes     Comment: occ            Subjective     Review of Systems:  All systems were reviewed and are negative unless pertinent positive stated in HPI.  CONSTITUTIONAL: No night sweats. No fatigue. No fever or chills.   Eyes: No visual changes. No eye pain.   ENT: No runny nose. No epistaxis.   RESPIRATORY: No cough. No hemoptysis. No shortness of breath.   CARDIOVASCULAR: No chest pains. No palpitations.   GASTROINTESTINAL: No abdominal pain. vomiting. No diarrhea or constipation.   GENITOURINARY: No urgency. No frequency. No dysuria.   MUSCULOSKELETAL: No musculoskeletal pain. No joint swelling.   NEUROLOGICAL: No headache or neck pain. No syncope or seizure.   PSYCHIATRIC: No depression, no psychosis.  SKIN: No rashes. No lesions. No petechiae.  ENDOCRINE: No unexplained weight loss. No polydipsia.   HEMATOLOGIC: No anemia. No purpura. No bleeding.   ALLERGIC AND IMMUNOLOGIC: No pruritus. No swelling.         Objective   Physical Exam:     Vitals: T:99.1 F (37.3 C) (Skin),  BP:130/74, HR:(!) 108, RR:12, SaO2:95%    1) General Appearance: Alert and oriented x 4. In no acute distress.   2) Eyes: Pink conjunctiva, anicteric sclera. Pupils are equally reactive to light.  3) ENT: Oral mucosa moist with no pharyngeal congestion, erythema or swelling.  4) Neck: Supple, with full range of motion. Trachea is central, no JVD noted  5) Chest: Clear to auscultation bilaterally, no wheezes or rhonchi.  6) CVS: normal rate and regular rhythm, with no murmurs.  7) Abdomen: Soft, non-tender, no palpable mass. Bowel sounds normal. No CVA tenderness  8) Extremities: No pitting edema, pulses palpable, no calf swelling and no gross deformity.  9) Skin: Warm, dry with normal skin turgor, no rash  10) Lymphatics: No lymphadenopathy in axillary, cervical and inguinal area.   11) Neurological: Cranial nerves II-XII intact. No gross focal motor or sensory deficits noted.  12) Psychiatric: Affect is appropriate.  No hallucinations.      Patient Vitals for the past 12 hrs:   BP Temp Pulse Resp   09/25/21 1755 130/74 99.1 F (37.3 C) (!) 108 12     Weight Monitoring 06/05/2019 08/05/2019 09/20/2021 09/25/2021   Height 162.6 cm 160 cm - 160 cm   Height Method Stated Stated - Stated   Weight 86.3 kg 82.1 kg 72.6 kg 72.6 kg   Weight Method Standing Scale Standing Scale Standing Scale Stated   BMI (calculated) 32.7 kg/m2 32.1 kg/m2 - 28.4 kg/m2           LABS:  Estimated Creatinine Clearance: 113.3 mL/min (based on SCr of 0.75 mg/dL).   Recent Results (from the past 24 hour(s))   CBC and differential    Collection Time: 09/25/21  6:05 PM   Result Value Ref Range    WBC 7.5 4.0 - 11.0 K/cmm    RBC 5.34 (H) 3.80 - 5.00 M/cmm    Hemoglobin 15.7 12.0 - 16.0 gm/dL    Hematocrit 60.4 54.0 - 48.0 %    MCV 88 80 - 100 fL    MCH 29 28 - 35 pg    MCHC 33 32 - 36 gm/dL    RDW 98.1 19.1 - 47.8 %    PLT CT 349 130 - 440 K/cmm    MPV 7.3 6.0 - 10.0 fL    Neutrophils % 62.0 42.0 - 78.0 %    Lymphocytes 28.0 15.0 - 46.0 %    Monocytes 9.2 3.0 - 15.0 %    Eosinophils % 0.3 0.0 - 7.0 %    Basophils % 0.5 0.0 - 3.0 %    Neutrophils Absolute 4.6 1.7 - 8.6 K/cmm    Lymphocytes Absolute 2.1 0.6 - 5.1 K/cmm    Monocytes Absolute 0.7 0.1 - 1.7 K/cmm    Eosinophils Absolute 0.0 0.0 - 0.8 K/cmm    Basophils Absolute 0.0 0.0 - 0.3 K/cmm   Basic Metabolic Panel    Collection Time: 09/25/21  6:05 PM   Result Value Ref Range    Sodium 140 136 - 147 mMol/L    Potassium 2.6 (LL) 3.5 - 5.3 mMol/L    Chloride 100 98 - 110 mMol/L  CO2 23 20 - 30 mMol/L    Calcium 10.1 8.5 - 10.5 mg/dL    Glucose 89 71 - 99 mg/dL    Creatinine 0.27 2.53 - 1.20 mg/dL    BUN 8 7 - 22 mg/dL    Anion Gap 66.4 (H) 7.0 - 18.0 mMol/L    BUN / Creatinine Ratio 10.7 10.0 - 30.0 Ratio    EGFR 116 60 - 150 mL/min/1.58m2    Osmolality Calculated 277 275 - 300 mOsm/kg   Collect Blood    Collection Time: 09/25/21  6:05 PM   Result Value Ref Range    Collect Blood Label Notification            Allergies   Allergen Reactions    Other      Mint        US OB < 14 Weeks    Result Date: 09/20/2021  Unremarkable 6 week 3 day intrauterine pregnancy. ReadingStation:[id number]      Home Medications       Med List Status: In Progress Set By: Margie Ege, RN at 09/25/2021  7:00 PM              escitalopram (LEXAPRO) 20 MG tablet     Take 20 mg by mouth daily     meclizine (ANTIVERT) 25 MG tablet     Take 1 tablet (25 mg total) by mouth every 6 (six) hours as needed for Dizziness     potassium chloride (KLOR-CON) 20 MEQ tablet     Take 1 tablet (20 mEq) by mouth 2 (two) times daily for 5 days     topiramate (TOPAMAX) 25 MG capsule     Take 50 mg by mouth daily           Meds given in the ED:  Medications   sodium chloride 0.9 % bolus 1,000 mL (1,000 mLs Intravenous New Bag 09/25/21 2031)   0.9 % NaCl with KCl 20 mEq infusion (has no administration in time range)   metoclopramide (REGLAN) injection 5 mg (5 mg Intravenous Given 09/25/21 1936)      Time Spent:     Jannette Fogo, MD     09/25/21,8:38 PM   MRN: [id number]                                      CSN: [id number] DOB: [birth year]

## 2021-09-25 NOTE — ED Notes (Signed)
[facility] EMERGENCY DEPARTMENT  ED NURSING NOTE FOR THE RECEIVING INPATIENT NURSE   ED NURSE IONGE:[id number]    ADMISSION INFORMATION   Kim Howe is a 20 y.o. female admitted with an ED diagnosis of:  1. Hyperemesis gravidarum    2. Acute hypokalemia    3. History of idiopathic intracranial hypertension    4. S/P VP shunt      ED Pre-Departure Assessment:  Pt in the ED for concerns of vomiting for the past 1 week- estimates vomiting every hour throughout the day, as well as waking up frequently from sleep to vomit. Pt was seen in the ED on Friday and prescribed Zofran with no relief. Pt is approx [redacted] weeks pregnant, G1P0. Pt reports abdominal soreness, however denies any vaginal bleeding, contractions or weakness. Hx of intracranial HTN & has a VP shunt. Pt denies any headache, change in vision or loss of vision. While in the ED, pt has been medicated per MAR. Pt is A&Ox4. VSS. Pt is ambulatory. Pt is appropriate for transport to the floor.     Plan:   -replace potassium (infusion of NaCl & KCl infusion coming from the pharmacy)  -see OB  -IV rehydration    Blood pressure 126/87, pulse 87, temperature 99.2 F (37.3 C), temperature source Temporal, resp. rate 19, height 1.6 m, weight 72.6 kg, SpO2 97 %.      NURSING CARE   Isolation None   Home O2 No   Patient Comes From: Home Independent   Mental Status: alert and oriented   Ambulation: no difficulty   Pertinent Info/Safety Concern: None   Report called to receiving RN?: Yes, courtesy

## 2021-09-25 NOTE — ED Provider Notes (Signed)
History     Chief Complaint   Patient presents with    Emesis During Pregnancy     Patient with a history of VP shunt for idiopathic intracranial hypertension is a G1, P0 states her last normal menstrual period was 1 year ago presents approximately 7 weeks intrauterine gestation with complaints of persistent unrelenting vomiting ongoing for more than a week.  Patient notes she is unable to keep down any food or fluids.  She describes emesis of blackish substances today.  She describes also bilious emesis and dry heaves.  Patient notes that she vomits approximately every hour through the day and wakes frequently while sleeping to vomit.  Patient was seen and evaluated here on Friday in the emergency department she had an ultrasound of the abdomen which showed a 6-week 3-day IUP on 09/20/21.  Patient has been taking Zofran without improvement.  Patient has not had any prenatal care thus far nor seen an obstetrician.  Patient has had no fever.  She denies any diarrhea.  Patient notes she has abdominal soreness but believes that is from persistent vomiting.  Patient denies any recent headaches or acute visual disturbances or loss.  She denies any vaginal bleeding or discharge.  She has had no contractions or gushes of fluid.  Patient has no weakness, numbness.  Patient has no history of gastritis, esophagitis or ulcers.    The history is provided by the patient.   Emesis  Severity:  Severe  Duration:  7 days  Timing:  Constant  Number of daily episodes:  15  Quality:  Bilious material and coffee grounds  Progression:  Worsening  Chronicity:  New  Recent urination:  Decreased  Context: not post-tussive and not self-induced    Relieved by:  Nothing  Worsened by:  Nothing  Ineffective treatments:  Antiemetics  Associated symptoms: abdominal pain    Associated symptoms: no arthralgias, no chills, no cough, no diarrhea, no fever, no headaches, no sore throat and no URI    Risk factors: pregnant    Risk factors: no  alcohol use, no diabetes, no prior abdominal surgery, no sick contacts, no suspect food intake and no travel to endemic areas       Past Medical History:   Diagnosis Date    Intracranial hypertension     PCOS (polycystic ovarian syndrome)        Past Surgical History:   Procedure Laterality Date    BRAIN SURGERY      brain shunt 3/20       History reviewed. No pertinent family history.    Social  Social History     Tobacco Use    Smoking status: Never    Smokeless tobacco: Never   Vaping Use    Vaping Use: Never used   Substance Use Topics    Alcohol use: Yes     Comment: occ    Drug use: Yes     Comment: occ       .     Allergies   Allergen Reactions    Other      Mint         Home Medications       Med List Status: In Progress Set By: Margie Ege, RN at 09/25/2021  7:00 PM              escitalopram (LEXAPRO) 20 MG tablet     Take 20 mg by mouth daily     meclizine (ANTIVERT) 25 MG tablet  Take 1 tablet (25 mg total) by mouth every 6 (six) hours as needed for Dizziness     potassium chloride (KLOR-CON) 20 MEQ tablet     Take 1 tablet (20 mEq) by mouth 2 (two) times daily for 5 days     topiramate (TOPAMAX) 25 MG capsule     Take 50 mg by mouth daily             Review of Systems   Constitutional:  Negative for chills and fever.   HENT:  Negative for congestion, ear pain, rhinorrhea and sore throat.    Eyes:  Negative for discharge and redness.   Respiratory:  Negative for cough.    Cardiovascular:  Negative for chest pain and leg swelling.   Gastrointestinal:  Positive for abdominal pain and vomiting. Negative for blood in stool, constipation, diarrhea and nausea.   Genitourinary:  Negative for dysuria, flank pain, frequency, hematuria, menstrual problem, vaginal bleeding and vaginal discharge.   Musculoskeletal:  Negative for arthralgias.   Skin:  Negative for rash.   Neurological:  Negative for seizures and headaches.   Hematological:  Negative for adenopathy. Does not bruise/bleed easily.    Psychiatric/Behavioral:  Negative for suicidal ideas.      Physical Exam    BP: 130/74, Heart Rate: (!) 108, Temp: 99.1 F (37.3 C), Resp Rate: 12, SpO2: 95 %, Weight: 72.6 kg    Physical Exam  Vitals and nursing note reviewed.   Constitutional:       General: She is not in acute distress.     Appearance: She is well-developed. She is not diaphoretic.   HENT:      Head: Normocephalic and atraumatic.      Right Ear: External ear normal.      Left Ear: External ear normal.      Nose: Nose normal.      Mouth/Throat:      Pharynx: No oropharyngeal exudate.   Eyes:      General: No scleral icterus.        Right eye: No discharge.         Left eye: No discharge.      Conjunctiva/sclera: Conjunctivae normal.      Right eye: Right conjunctiva is not injected.      Pupils: Pupils are equal, round, and reactive to light.   Neck:      Thyroid: No thyroid mass or thyromegaly.      Trachea: No tracheal deviation.   Cardiovascular:      Rate and Rhythm: Normal rate and regular rhythm.      Pulses: Normal pulses.      Heart sounds: Normal heart sounds. No murmur heard.    No friction rub. No gallop.   Pulmonary:      Effort: Pulmonary effort is normal. No accessory muscle usage or respiratory distress.      Breath sounds: Normal breath sounds. No stridor. No wheezing or rales.   Abdominal:      General: Bowel sounds are normal. There is no distension.      Palpations: Abdomen is soft. There is no mass.      Tenderness: There is no abdominal tenderness. There is no guarding or rebound.   Musculoskeletal:         General: No tenderness. Normal range of motion.      Cervical back: Normal range of motion and neck supple.   Skin:     General: Skin is warm and dry.  Coloration: Skin is not pale.      Findings: No erythema or rash.   Neurological:      General: No focal deficit present.      Mental Status: She is alert and oriented to person, place, and time.      GCS: GCS eye subscore is 4. GCS verbal subscore is 5. GCS motor  subscore is 6.      Cranial Nerves: Cranial nerves 2-12 are intact. No cranial nerve deficit.      Sensory: Sensation is intact.      Motor: Motor function is intact. No abnormal muscle tone.      Coordination: Coordination is intact. Coordination normal.   Psychiatric:         Behavior: Behavior normal.         MDM and ED Course     ED Medication Orders (From admission, onward)      Start Ordered     Status Ordering Provider    09/25/21 2031 09/25/21 2030  0.9 % NaCl with KCl 20 mEq infusion  Continuous        Route: Intravenous     Acknowledged Fabian Sharp    09/25/21 2025 09/25/21 2024  sodium chloride 0.9 % bolus 1,000 mL  Once in ED        Route: Intravenous  Ordered Dose: 1,000 mL     Last MAR action: New Bag Sonia Baller R    09/25/21 1934 09/25/21 1933  metoclopramide (REGLAN) injection 5 mg  Once in ED        Route: Intravenous  Ordered Dose: 5 mg     Last MAR action: Given Sonia Baller R               MDM  Number of Diagnoses or Management Options  Acute hypokalemia: new and requires workup  History of idiopathic intracranial hypertension: established and improving  Hyperemesis gravidarum: new and requires workup  S/P VP shunt: established and improving  Diagnosis management comments: The patient presents to the Emergency Department with nausea/vomiting that is significant.  The patient underwent evaluation and treatment in the ER and appears ill enough to warrant admission based on the patients clinical status, volume status, electrolytes and vital signs. At present the patient is not suffering from a surgical or life threatening issue, but will benefit from admission, IVFs, treatment and possible further testing. The differential diagnosis has included but is not limited to bowel obstruction, gastroenteritis, colitis, dysentery, diverticulitis and/or inflammatory bowel condition.  Laboratory/radiologic tests have been sent and the ones resulted were reviewed and discussed with patient  and/or family.  The recommended disposition was discussed and agreed upon with the patient/family and questions and concerns were addressed.  The admitting physician was consulted and informed about any pending studies.  They will admit and continue management of this patient.             Amount and/or Complexity of Data Reviewed  Clinical lab tests: ordered and reviewed  Tests in the radiology section of CPT: reviewed  Tests in the medicine section of CPT: ordered and reviewed  Decide to obtain previous medical records or to obtain history from someone other than the patient: yes  Discuss the patient with other providers: yes  Independent visualization of images, tracings, or specimens: yes    Risk of Complications, Morbidity, and/or Mortality  Presenting problems: moderate  Diagnostic procedures: low  Management options: moderate    Patient Progress  Patient progress: improved  Procedures    Clinical Impression & Disposition     Clinical Impression  Final diagnoses:   Hyperemesis gravidarum   Acute hypokalemia   History of idiopathic intracranial hypertension   S/P VP shunt        ED Disposition       ED Disposition   Admit    Condition   --    Date/Time   Wed Sep 25, 2021  8:35 PM    Comment   Service: Medicine [[id number]]                  New Prescriptions    No medications on file                   Fabian Sharp, MD  09/25/21 2121

## 2021-09-26 ENCOUNTER — Encounter: Payer: Self-pay | Admitting: Obstetrics & Gynecology

## 2021-09-26 DIAGNOSIS — F329 Major depressive disorder, single episode, unspecified: Secondary | ICD-10-CM | POA: Insufficient documentation

## 2021-09-26 DIAGNOSIS — G932 Benign intracranial hypertension: Secondary | ICD-10-CM | POA: Insufficient documentation

## 2021-09-26 DIAGNOSIS — F411 Generalized anxiety disorder: Secondary | ICD-10-CM | POA: Insufficient documentation

## 2021-09-26 LAB — VH URINALYSIS WITH MICROSCOPIC AND CULTURE IF INDICATED
Bilirubin, UA: NEGATIVE
Glucose, UA: NEGATIVE mg/dL
Ketones UA: 80 mg/dL — AB
Leukocyte Esterase, UA: NEGATIVE Leu/uL
Nitrite, UA: NEGATIVE
Protein, UR: 100 mg/dL — AB
RBC, UA: 3 /hpf (ref 0–5)
Squam Epithel, UA: 12 /hpf — ABNORMAL HIGH (ref 0–2)
Trans Epithel, UA: <1 /hpf (ref 0–2)
Urine Specific Gravity: 1.031 (ref 1.001–1.040)
Urobilinogen, UA: 2 mg/dL — AB
WBC, UA: 6 /hpf — ABNORMAL HIGH (ref 0–4)
pH, Urine: 5 pH (ref 5.0–8.0)

## 2021-09-26 LAB — VH URINE DRUG SCREEN - NO CONFIRMATION
Propoxyphene: NEGATIVE
Urine Amphetamine: NEGATIVE
Urine Barbiturates: NEGATIVE
Urine Benzodiazepines: NEGATIVE
Urine Buprenorphine: NEGATIVE
Urine Cannabinoids: POSITIVE — AB
Urine Cocaine: NEGATIVE
Urine Creatinine Random: 268 mg/dL
Urine Ecstasy Screen: NEGATIVE
Urine Fentanyl Screen: NEGATIVE
Urine Methadone Screen: NEGATIVE
Urine Opiates: NEGATIVE
Urine Oxycodone: NEGATIVE
Urine Phencyclidine: NEGATIVE
Urine Specific Gravity: 1.032 (ref 1.001–1.040)
pH, Urine: 6 pH (ref 5.0–8.0)

## 2021-09-26 LAB — COMPREHENSIVE METABOLIC PANEL
ALT: 10 U/L (ref 0–55)
AST (SGOT): 14 U/L (ref 10–42)
Albumin/Globulin Ratio: 1.33 Ratio (ref 0.80–2.00)
Albumin: 4 gm/dL (ref 3.5–5.0)
Alkaline Phosphatase: 53 U/L (ref 40–145)
Anion Gap: 18.8 mMol/L — ABNORMAL HIGH (ref 7.0–18.0)
BUN / Creatinine Ratio: 9.1 Ratio — ABNORMAL LOW (ref 10.0–30.0)
BUN: 6 mg/dL — ABNORMAL LOW (ref 7–22)
Bilirubin, Total: 0.7 mg/dL (ref 0.1–1.2)
CO2: 16 mMol/L — ABNORMAL LOW (ref 20–30)
Calcium: 9 mg/dL (ref 8.5–10.5)
Chloride: 105 mMol/L (ref 98–110)
Creatinine: 0.66 mg/dL (ref 0.60–1.20)
EGFR: 128 mL/min/{1.73_m2} (ref 60–150)
Globulin: 3 gm/dL (ref 2.0–4.0)
Glucose: 65 mg/dL — ABNORMAL LOW (ref 71–99)
Osmolality Calculated: 270 mOsm/kg — ABNORMAL LOW (ref 275–300)
Potassium: 2.8 mMol/L — CL (ref 3.5–5.3)
Protein, Total: 7 gm/dL (ref 6.0–8.3)
Sodium: 137 mMol/L (ref 136–147)

## 2021-09-26 LAB — BASIC METABOLIC PANEL
Anion Gap: 18.2 mMol/L — ABNORMAL HIGH (ref 7.0–18.0)
BUN / Creatinine Ratio: 11.1 Ratio (ref 10.0–30.0)
BUN: 7 mg/dL (ref 7–22)
CO2: 19 mMol/L — ABNORMAL LOW (ref 20–30)
Calcium: 9.4 mg/dL (ref 8.5–10.5)
Chloride: 106 mMol/L (ref 98–110)
Creatinine: 0.63 mg/dL (ref 0.60–1.20)
EGFR: 129 mL/min/{1.73_m2} (ref 60–150)
Glucose: 78 mg/dL (ref 71–99)
Osmolality Calculated: 276 mOsm/kg (ref 275–300)
Potassium: 3.2 mMol/L — ABNORMAL LOW (ref 3.5–5.3)
Sodium: 140 mMol/L (ref 136–147)

## 2021-09-26 LAB — MAGNESIUM: Magnesium: 1.8 mg/dL (ref 1.6–2.6)

## 2021-09-26 LAB — PHOSPHORUS: Phosphorus: 3.8 mg/dL (ref 2.3–4.7)

## 2021-09-26 MED ORDER — SODIUM CHLORIDE (PF) 0.9 % IJ SOLN
3.0000 mL | Freq: Two times a day (BID) | INTRAMUSCULAR | Status: DC
Start: 2021-09-26 — End: 2021-09-27
  Administered 2021-09-26 – 2021-09-27 (×3): 3 mL via INTRAVENOUS

## 2021-09-26 MED ORDER — SODIUM CHLORIDE 0.9 % IV SOLN
INTRAVENOUS | Status: AC
Start: 2021-09-26 — End: 2021-09-27

## 2021-09-26 MED ORDER — ACETAMINOPHEN 325 MG PO TABS
650.0000 mg | ORAL_TABLET | ORAL | Status: DC | PRN
Start: 2021-09-26 — End: 2021-09-27

## 2021-09-26 MED ORDER — VITAMIN B-6 25 MG PO TABS
25.0000 mg | ORAL_TABLET | Freq: Three times a day (TID) | ORAL | Status: DC
Start: 2021-09-26 — End: 2021-09-27
  Administered 2021-09-26 – 2021-09-27 (×2): 25 mg via ORAL
  Filled 2021-09-26 (×5): qty 1

## 2021-09-26 MED ORDER — ACETAMINOPHEN 650 MG RE SUPP
650.0000 mg | RECTAL | Status: DC | PRN
Start: 2021-09-26 — End: 2021-09-27

## 2021-09-26 MED ORDER — SODIUM CHLORIDE (PF) 0.9 % IJ SOLN
0.4000 mg | INTRAMUSCULAR | Status: DC | PRN
Start: 2021-09-26 — End: 2021-09-26

## 2021-09-26 MED ORDER — POTASSIUM CHLORIDE 10 MEQ/100ML IV SOLN
10.0000 meq | INTRAVENOUS | Status: AC
Start: 2021-09-26 — End: 2021-09-26
  Administered 2021-09-26 (×3): 10 meq via INTRAVENOUS
  Filled 2021-09-26: qty 100

## 2021-09-26 MED ORDER — ACETAMINOPHEN 160 MG/5ML PO SOLN
650.0000 mg | ORAL | Status: DC | PRN
Start: 2021-09-26 — End: 2021-09-27

## 2021-09-26 MED ORDER — DOXYLAMINE-PYRIDOXINE 10-10 MG PO TBEC
2.0000 | DELAYED_RELEASE_TABLET | Freq: Every evening | ORAL | 0 refills | Status: DC
Start: 2021-09-26 — End: 2022-05-16

## 2021-09-26 MED ORDER — PROMETHAZINE HCL 25 MG PO TABS
12.5000 mg | ORAL_TABLET | Freq: Four times a day (QID) | ORAL | Status: DC
Start: 2021-09-26 — End: 2021-09-27
  Administered 2021-09-26: 18:00:00 12.5 mg via ORAL
  Filled 2021-09-26: qty 1

## 2021-09-26 MED ORDER — POTASSIUM CHLORIDE 20 MEQ/15ML (10%) PO SOLN
40.0000 meq | Freq: Every day | ORAL | Status: DC
Start: 2021-09-26 — End: 2021-09-27
  Administered 2021-09-26 – 2021-09-27 (×2): 40 meq via ORAL
  Filled 2021-09-26 (×2): qty 30

## 2021-09-26 NOTE — Plan of Care (Signed)
Problem: Safety  Goal: Patient will be free from injury during hospitalization  Outcome: Progressing  Goal: Patient will be free from infection during hospitalization  Outcome: Progressing

## 2021-09-26 NOTE — Consults (Signed)
OB HOSPITALIST CONSULTATION REPORT    IMPRESSION/RECOMMENDATIONS:    Hyperemesis gravidarum - patient has already improved after IVF support and has had only 1 dose of Reglan and 1 dose of Zofran since presenting last night.  I would like to put her on Diclegis but this is not available in the hospital.  I have sent the prescription to her pharmacy.  For now she can at least take the vitamin B6 component.  I ordered that for 25 mg PO q8h.  As a second line I prefer Phenergan and have ordered that 12.5 mg PO q6h.  She can continue Zofran IV as needed or could try Reglan or Compazine.  I recommend keeping her overnight again tonight and if able to keep down clears and at least some of her meals then discharge tomorrow.  She and I discussed food choices and behavioral changes to help as well.  I agree with continuing IVF support.  Hypokalemia secondary to above - would continue to replace until back to normal.  IUP at [redacted]w[redacted]d - continue PNV if able to tolerate.  Patient has already made an appt to start prenatal care with [facility].  I advised her to f/u with them a week after discharge.  I also notified the on-call physician for that group, Dr. Kizzie Furnish, and she agrees with the plan.    Thank you for this consult.  The on-call ob/gyn hospitalist is in house 24/7 and is reachable at [phone].  Please call if any other concerns or questions about this patient.      Chief Complaint   Patient presents with    Emesis During Pregnancy       HPI:   Kim Howe is a 20 y.o. G1P0 female with a gestational age of [redacted]w[redacted]d and Estimated Date of Delivery: 05/13/22 who presents to the hospital for:  Vomiting.    Kim Howe presented to the ED yesterday with nausea and vomiting.  She said she had not been able to keep anything down for about a week.  She was also feeling weak and had a headache.  She has been treated with IVF, Reglan, and Zofran.  She was found to be hypokalemic and has been getting replacement.  She  reports feeling better today.  Less nausea.  Has been able to keep down some water and toast.  She reports consistent lower abd pain for the past week.  Feels like squeezing and like pushing out.  Rates 4/10 but sometimes up to 8/10 for a few minutes.  She had some spotting last week but no vag bleeding now.  She denies abnormal vaginal discharge or itching.  She also reports she has had some substernal chest pain and epigastric pain with vomiting and deep breaths.  Just now has developed a sore throat.  Denies diarrhea or constipation but says she has not been having many bowel movements lately she thinks because of not eating.  Denies dysuria.    She has not yet had any prenatal care.  She was seen in the ED on 11/4 with vomiting and was discharged with Zofran.  She feels like that is not helping.  She is scheduled for her first prenatal visit with [facility] in December.    She reports a hx of PCOS and did not think she could get pregnant.  Her periods are irregular and her last one was maybe 6 mos ago.  The pregnancy is dated by an U/S done in the ED on 09/20/21.  The U/S was normal.  She has never had a Pap.  She denies other gyn related problems.  Denies hx of STDs or pelvic infections.      Review of Systems:  Review of Systems   Constitutional:  Positive for fever (subjective on 11/4).   HENT:  Positive for sore throat. Negative for congestion.    Respiratory:  Negative for cough and shortness of breath.    Cardiovascular:  Positive for chest pain (with deep breath).   Gastrointestinal:  Positive for abdominal pain, constipation, nausea and vomiting. Negative for diarrhea.   Genitourinary:  Negative for dysuria, vaginal bleeding, vaginal discharge and vaginal pain.   Skin:  Negative for rash.   Neurological:  Positive for weakness and headaches.   Psychiatric/Behavioral:  Positive for dysphoric mood (chronic). The patient is nervous/anxious (chronic).        OB History   Gravida Para Term Preterm AB  Living   1             SAB IAB Ectopic Multiple Live Births                  # Outcome Date GA Lbr Len/2nd Weight Sex Delivery Anes PTL Lv   1 Current                Past Medical History:   Diagnosis Date    Anxiety     Depression     Intracranial hypertension     PCOS (polycystic ovarian syndrome)        Past Surgical History:   Procedure Laterality Date    BRAIN SURGERY      brain shunt 3/20       Allergies   Allergen Reactions    Other      Mint         Medications:  PNV  Zofran    Social History:  She  reports that she has never smoked. She has never used smokeless tobacco. She reports that she does not currently use alcohol. She reports that she does not currently use drugs after having used the following drugs: Marijuana.  Was using marijuana but stopped with pregnancy    History reviewed. No pertinent family history.      Vital Signs:     Temp:  [98.1 F (36.7 C)-99.2 F (37.3 C)] 98.2 F (36.8 C)  Heart Rate:  [73-108] 73  Resp Rate:  [12-19] 16  BP: (103-130)/(61-87) 125/77    Physical Exam:     General appearance - alert, well appearing, and in no distress  Mental status - alert, oriented to person, place, and time; mood appropriate  Chest - non-labored breathing  Abdomen - soft, mildly tender to palp across lower abd; unable to palpable uterus; nondistended, no masses or organomegaly  Pelvic - deferred  Neurological - alert, oriented, normal speech, no focal findings or movement disorder noted  Extremities - no pedal edema noted, no calf tenderness    Labs:     Results       Procedure Component Value Units Date/Time    Basic Metabolic Panel [[id number]]  (Abnormal) Collected: 09/26/21 1513    Specimen: Plasma Updated: 09/26/21 1558     Sodium 140 mMol/L      Potassium 3.2 mMol/L      Chloride 106 mMol/L      CO2 19 mMol/L      Calcium 9.4 mg/dL      Glucose 78 mg/dL  Creatinine 0.63 mg/dL      BUN 7 mg/dL      Anion Gap 40.9 mMol/L      BUN / Creatinine Ratio 11.1 Ratio      EGFR 129 mL/min/1.19m2       Osmolality Calculated 276 mOsm/kg     Urinalysis w Microscopic and Culture if Indicated [[id number]]  (Abnormal) Collected: 09/26/21 1318    Specimen: Urine, Random Updated: 09/26/21 1512     Color, UA Amber - Color may affect some analytes.     Clarity, UA Cloudy     Urine Specific Gravity 1.031     pH, Urine 5.0 pH      Protein, UR 100 mg/dL      Glucose, UA Negative mg/dL      Ketones UA 80 mg/dL      Bilirubin, UA Negative     Blood, UA Small     Nitrite, UA Negative     Urobilinogen, UA 2.0 mg/dL      Leukocyte Esterase, UA Negative Leu/uL      UR Micro Performed     WBC, UA 6 /hpf      RBC, UA 3 /hpf      Bacteria, UA Rare /hpf      Squam Epithel, UA 12 /hpf      Trans Epithel, UA <1 /hpf     Narrative:      A Urine Culture has been ordered based upon the Positive UA results.    Urine Drug Screen - No Confirmation [[id number]]  (Abnormal) Collected: 09/26/21 1318    Specimen: Urine, Random Updated: 09/26/21 1406     Urine Creatinine Random 268 mg/dL      pH, Urine 6.0 pH      Urine Specific Gravity 1.032     Urine Amphetamine Negative     Urine Barbiturates Negative     Urine Benzodiazepines Negative     Urine Cannabinoids Presumptive Positive     Urine Cocaine Negative     Urine Methadone Screen Negative     Urine Opiates Negative     Urine Oxycodone Negative     Urine Phencyclidine Negative     Propoxyphene Negative     Urine Buprenorphine Negative     Urine Ecstasy Screen Negative     Urine Fentanyl Screen Negative    Narrative:      This is a screening test only.  Any presumptive positive result should be confirmed by more definitive methodologies. Any unconfirmed results should be used for medical purposes only.  Samples with creatinine concentrations <20 mg/dl are considered suspect and resubmission is recommended.  The following thresholds are the minimal levels for detection:  Amphetamine    1000 ng/mL  Methadone     300 ng/mL  Barbiturate     200 ng/mL  Opiate        300 ng/mL  Benzodiazepine  200  ng/mL  Oxycodone     100 ng/mL  Cannabinoid      50 ng/mL  Phenyclidine   25 ng/mL  Cocaine         300 ng/mL  Propoxyphene  300 ng/mL  Buprenorphine     5 ng/mL  Ecstasy       500 ng/mL  Fentanyl          2 ng/mL    Comprehensive metabolic panel [[id number]]  (Abnormal) Collected: 09/26/21 0452    Specimen: Plasma Updated: 09/26/21 0658     Sodium 137 mMol/L  Potassium 2.8 mMol/L      Chloride 105 mMol/L      CO2 16 mMol/L      Calcium 9.0 mg/dL      Glucose 65 mg/dL      Creatinine 1.61 mg/dL      BUN 6 mg/dL      Protein, Total 7.0 gm/dL      Albumin 4.0 gm/dL      Alkaline Phosphatase 53 U/L      ALT 10 U/L      AST (SGOT) 14 U/L      Bilirubin, Total 0.7 mg/dL      Albumin/Globulin Ratio 1.33 Ratio      Anion Gap 18.8 mMol/L      BUN / Creatinine Ratio 9.1 Ratio      EGFR 128 mL/min/1.8m2      Osmolality Calculated 270 mOsm/kg      Globulin 3.0 gm/dL     Phosphorus [[id number]] Collected: 09/26/21 0452    Specimen: Plasma Updated: 09/26/21 0648     Phosphorus 3.8 mg/dL     Magnesium [[id number]] Collected: 09/26/21 0452    Specimen: Plasma Updated: 09/26/21 0648     Magnesium 1.8 mg/dL     Beta HCG Quantitative [[id number]] Collected: 09/25/21 1805    Specimen: Plasma Updated: 09/25/21 2132     BHCG Quant. 79,558.0 mIU/mL     Prothrombin time/INR [[id number]] Collected: 09/25/21 1800    Specimen: Blood Updated: 09/25/21 2114     PT 10.9 sec      PT INR 1.0    Lipase [[id number]] Collected: 09/25/21 1805    Specimen: Plasma Updated: 09/25/21 2113     Lipase 14 U/L     Hepatic function panel (LFT) [[id number]]  (Abnormal) Collected: 09/25/21 1805    Specimen: Plasma Updated: 09/25/21 2113     Protein, Total 8.8 gm/dL      Albumin 4.9 gm/dL      Alkaline Phosphatase 64 U/L      ALT 12 U/L      AST (SGOT) 17 U/L      Bilirubin, Total 0.8 mg/dL      Bilirubin Direct 0.3 mg/dL      Albumin/Globulin Ratio 1.26 Ratio      Globulin 3.9 gm/dL     Magnesium [[id number]] Collected: 09/25/21 1805    Specimen: Plasma Updated:  09/25/21 2113     Magnesium 1.9 mg/dL     CBC and differential [[id number]]  (Abnormal) Collected: 09/25/21 1805    Specimen: Blood Updated: 09/25/21 1912     WBC 7.5 K/cmm      RBC 5.34 M/cmm      Hemoglobin 15.7 gm/dL      Hematocrit 59.5 %      MCV 88 fL      MCH 29 pg      MCHC 33 gm/dL      RDW 63.8 %      PLT CT 349 K/cmm      MPV 7.3 fL      Neutrophils % 62.0 %      Lymphocytes 28.0 %      Monocytes 9.2 %      Eosinophils % 0.3 %      Basophils % 0.5 %      Neutrophils Absolute 4.6 K/cmm      Lymphocytes Absolute 2.1 K/cmm      Monocytes Absolute 0.7 K/cmm      Eosinophils Absolute 0.0 K/cmm      Basophils Absolute 0.0  K/cmm     Basic Metabolic Panel [[id number]]  (Abnormal) Collected: 09/25/21 1805    Specimen: Plasma Updated: 09/25/21 1856     Sodium 140 mMol/L      Potassium 2.6 mMol/L      Chloride 100 mMol/L      CO2 23 mMol/L      Calcium 10.1 mg/dL      Glucose 89 mg/dL      Creatinine 9.14 mg/dL      BUN 8 mg/dL      Anion Gap 78.2 mMol/L      BUN / Creatinine Ratio 10.7 Ratio      EGFR 116 mL/min/1.34m2      Osmolality Calculated 277 mOsm/kg              Glendale Chard, MD

## 2021-09-26 NOTE — UM Notes (Signed)
Initial obsv review 11/10 0715    Dx-Hyperemesis gravidarum (7 weeks)    Hx-mood disorder, idiopathic intracranial hypertension status post shunt        presents to the ED from home with chief complaint of nausea vomiting.  Of note patient is [redacted] weeks pregnant.  Of note patient was recently in the ED 09/20/2021 for similar presentation and was diagnosed with possible cannabis related nausea vomiting.  Patient was known to be pregnant then as well.  Denies any chest pain.  Significant other bedside during entire interview.    T:99.1 F (37.3 C) (Skin),  BP:130/74, HR:(!) 108, RR:12, SaO2:95%        CBC grossly unremarkable  -BMP shows potassium 2.6      Admit order 11/9    Admitted obsv   Iv fluids x 1 liter bolus then ns at 120   Supplement potassium and follow labs

## 2021-09-26 NOTE — UM Notes (Signed)
22 hours obsv   Taking sips of water no diet yet   Nausea improving per notes   Iv fluids at 100/hr    Iv zofran x 1

## 2021-09-26 NOTE — Progress Notes (Signed)
Medicine Progress Note   [facility]  [employer]   Patient Name: Kim Howe,Kim Howe LOS: 0 days   Attending Physician: Kamry Faraci, Judd Lien, MD PCP: Marisa Sprinkles, MD      Hospital Course:                                                            Kim Howe is a 20 y.o. female patient with past medical history of mood disorder, idiopathic intracranial hypertension status post shunt presents to the ED from home with chief complaint of nausea vomiting.  Patient is [redacted] weeks pregnant.  Of note patient was recently in the ED 09/20/2021 for similar presentation and was diagnosed with possible cannabis related nausea vomiting.  Patient was known to be pregnant then as well.     Admitted with inability to tolerate PO, K 2.6, metabolic acidosis  likely from hyperemesis gravidum.Ob consulted.     Assessment and Plan:      Hyperemesis gravidarum (7 weeks)  With Superimposed Cannabinoids hyperemesis syndrome   Inability to tolerate PO, vomiting Q1 H   Presenting with severe electrolyte abnormalities and recurrent ER presentation  No symptom improvement with o/p Zofran  No vaginal discharge/bleeding or abdominal pain  OB consulted   C/w IV Zofran PRN   Added Vit B6 and Phenergan  Diet as tolerated, able to take few sips of water only today  Urine Drug screen + cannabinoids, Pt reports using a week ago last  IVF    Acute Severe Hypokalemia  -Potassium 2.6, improved to 3.2 after multiple IV and PO repletion  KCL PO 40 meq daily added  Repeat in am      Metabolic acidosis:  2/2 GI loss from vomiting  Serum CO2 16, repeat in am    [redacted] weeks pregnant  -Has not had any prenatal care  -OB/GYN consulted, Spoke with Dr.Kula, she will attempt to arrange earlier o/p Ob follow up, Pt currently has appointment in December    Idiopathic intracranial hypertension status post shunt  -Monitor for now  No vision changes or headache  Will get CT head im no improvement in symptoms or new symptoms      DVT PPx:  Heparin    Dispo: Observation, Discharge home tomorrow once improved PO intake     Healthcare Proxy: Mother  Code: full code         Subjective   Feels better. Taking sips of water. Has not ordered food yet. Denie svaginal discharge or abdominal pain           Objective   Physical Exam:       Vitals: T:98.2 F (36.8 C) (Temporal), BP:125/77, HR:73, RR:16, SaO2:100%       General: Patient is awake. In no acute distress.  HEENT: No conjunctival drainage, vision is intact, anicteric sclera.  Neck: Supple, no thyromegaly.  Chest: CTA bilaterally. No rhonchi, no wheezing. No use of accessory muscles.  CVS: Normal rate and regular rhythm no murmurs, without JVD, no pitting edema, pulses palpable.  Abdomen: Soft, non-tender, no guarding or rigidity, with normal bowel sounds.  Extremities: No calf swelling and no gross deformity.  Skin: Warm, dry, no rash and no worrisome lesions.  NEURO: No motor or sensory deficits.  Psychiatric: Alert, interactive, appropriate, normal affect.  Weight Monitoring 06/05/2019 08/05/2019 09/20/2021 09/25/2021 09/25/2021   Height 162.6 cm 160 cm - 160 cm -   Height Method Stated Stated - Stated -   Weight 86.3 kg 82.1 kg 72.6 kg 72.6 kg 68.085 kg   Weight Method Standing Scale Standing Scale Standing Scale Stated Standing Scale   BMI (calculated) 32.7 kg/m2 32.1 kg/m2 - 28.4 kg/m2 -           Intake/Output Summary (Last 24 hours) at 09/26/2021 1723  Last data filed at 09/26/2021 1100  Gross per 24 hour   Intake --   Output 125 ml   Net -125 ml     Body mass index is 26.6 kg/m.     Meds:     Current Facility-Administered Medications   Medication Dose Route Frequency    potassium chloride  40 mEq Oral Daily    promethazine  12.5 mg Oral Q6H    sodium chloride (PF)  3 mL Intravenous Q12H SCH    vitamin B-6  25 mg Oral Q8H      sodium chloride 100 mL/hr at 09/26/21 1329     PRN Meds: acetaminophen **OR** acetaminophen **OR** acetaminophen, ondansetron.     LABS:     Estimated Creatinine Clearance:  130.9 mL/min (based on SCr of 0.63 mg/dL).  Recent Labs   Lab 09/25/21  1805 09/20/21  1055   WBC 7.5 9.1   RBC 5.34* 4.90   Hemoglobin 15.7 14.5   Hematocrit 47.0 44.1   MCV 88 90   PLT CT 349 307     Recent Labs   Lab 09/25/21  1800   PT 10.9   PT INR 1.0         No results found for: HGBA1CPERCNT  Recent Labs   Lab 09/26/21  1513 09/26/21  0452 09/25/21  1805   Glucose 78 65* 89   Sodium 140 137 140   Potassium 3.2* 2.8* 2.6*   Chloride 106 105 100   CO2 19* 16* 23   BUN 7 6* 8   Creatinine 0.63 0.66 0.75   EGFR 129 128 116   Calcium 9.4 9.0 10.1     Recent Labs   Lab 09/26/21  0452 09/25/21  1805   Magnesium 1.8 1.9   Phosphorus 3.8  --    Albumin 4.0 4.9   Protein, Total 7.0 8.8*   Bilirubin, Total 0.7 0.8   Alkaline Phosphatase 53 64   ALT 10 12   AST (SGOT) 14 17     Recent Labs   Lab 09/26/21  1318   Urine Specific Gravity 1.031   1.032   pH, Urine 5.0   6.0   Protein, UR 100*   Glucose, UA Negative   Ketones UA 80*   Bilirubin, UA Negative   Blood, UA Small*   Nitrite, UA Negative   Urobilinogen, UA 2.0*   Leukocyte Esterase, UA Negative   WBC, UA 6*   RBC, UA 3   Bacteria, UA Rare*      Patient Lines/Drains/Airways Status       Active PICC Line / CVC Line / PIV Line / Drain / Airway / Intraosseous Line / Epidural Line / ART Line / Line / Wound / Pressure Ulcer / NG/OG Tube       Name Placement date Placement time Site Days    Peripheral IV 09/25/21 20 G Left Antecubital 09/25/21  1810  Antecubital  less than 1  US OB < 14 Weeks    Result Date: 09/20/2021  Unremarkable 6 week 3 day intrauterine pregnancy. ReadingStation:[email or web address]    Home Health Needs:  There are no questions and answers to display.       Nutrition assessment done in collaboration with Registered Dietitians:     Time spent:      Lacie Scotts, MD     09/26/21,5:23 PM   MRN: [id number]                                      CSN: [id number] DOB: 2001-08-14

## 2021-09-26 NOTE — Progress Notes (Signed)
Readmission Risk  [facility] MEDICAL OVERFLOW   Patient Name: Kim Howe   Attending Physician: Mir, Judd Lien, MD   Today's date:   09/26/2021 LOS: 0 days   Expected Discharge Date      Readmission Assessment:                                                              Discharge Planning  Does the patient have perscription coverage?: Yes  Utilize [facility]: No  Confirmed PCP with Pt: No  Confirmed PCP name: No PCP - declined TC referral  Confirm Transport to F/U Appt.: Self/Private Vehicle/Friend  Social Work Referral: Not Applicable  Anticipated Home Health at [place]: No, Doesn't qualify  Anticipated Placement at [place]: No, Doesn't qualify  CM Comments: 09/26/2021 SW SN   Patient with hyperemesis gravidarum, observation, [redacted] weeks pregnant, SW screened pt/SO at bedside. Pt resides w/ SO, aunt and uncle, no DME used at baseline. Pt declined referral to [facility], is in process of establishing PCP herself. Pt reports SO to transport at [place], no needs identified. Following.       IDPA:      Healthcare Decisions  Interviewed:: Patient, Significant Other  Orientation/Decision Making Abilities of Patient: Alert and Oriented x3, able to make decisions  Advance Directive: Patient does not have advance directive  Healthcare Agent Appointed: No  Prior to admission  Prior level of function: Independent with ADLs, Ambulates independently  Type of Residence: Private residence  Have running water, electricity, heat, etc?: Yes  Living Arrangements: Spouse/significant other, Family members  How do you get to your MD appointments?: Self/Family  How do you get your groceries?: Self/Family  Who fixes your meals?: Self/Family  Who does your laundry?: Self/Family  Who picks up your prescriptions?: Self/Family  Dressing: Independent  Grooming: Independent  Feeding: Independent  Bathing: Independent  Toileting: Independent  Discharge Planning  Support Systems: Family members,  Spouse/significant other, Parent  Patient expects to be discharged to:: home  Anticipated [place] plan discussed with:: Same as interviewed  Mode of transportation:: Private car (family member)  Does the patient have perscription coverage?: Yes  Consults/Providers  Correct PCP listed in Epic?: No (comment)  Family and PCP  PCP on file was verified as the current PCP?: Patient/family states they do not have a PCP (Pt declined [facility] follow up, plans to get established w/ PCP on her own.)   30 Day Readmission:       Provider Notifications:

## 2021-09-26 NOTE — Plan of Care (Signed)
NURSE NOTE SUMMARY  [facility] MEDICAL OVERFLOW   Patient Name: Kim Howe   Attending Physician: Jannette Fogo, MD   Today's date:   09/26/2021 LOS: 0 days   Shift Summary:                                                              2200 admitted from ER with transport and significant other. A/OX4. IVF infusing well. Oriented to room. Safety precautions initiated call bell placed within pt's reached and instructed to call for assistance at all times. New iv site started. Will continue to monitor closely and continue with POC   Provider Notifications:        Rapid Response Notifications:  Mobility:      PMP Activity: Step 6 - Walks in Room (09/26/2021 12:00 AM)     Weight tracking:  Family Dynamic:   Last 3 Weights for the past 72 hrs (Last 3 readings):   Weight   09/25/21 2300 68.1 kg (150 lb 1.6 oz)   09/25/21 1755 72.6 kg (160 lb 0.9 oz)             Last Bowel Movement   No data recorded       Problem: Safety  Goal: Patient will be free from injury during hospitalization  Outcome: Progressing  Flowsheets (Taken 09/26/2021 0025)  Patient will be free from injury during hospitalization:   Assess patient's risk for falls and implement fall prevention plan of care per policy   Ensure appropriate safety devices are available at the bedside   Assess for patients risk for elopement and implement Elopement Risk Plan per policy     Problem: Fluid and Electrolyte Imbalance/ Endocrine  Goal: Fluid and electrolyte balance are achieved/maintained  Outcome: Progressing  Flowsheets (Taken 09/26/2021 0025)  Fluid and electrolyte balance are achieved/maintained:   Monitor intake and output every shift   Monitor/assess lab values and report abnormal values   Provide adequate hydration   Assess for confusion/personality changes  Goal: Adequate hydration  Outcome: Progressing  Flowsheets (Taken 09/26/2021 0025)  Adequate hydration:   Assess mucus membranes, skin color, turgor, perfusion and  presence of edema   Monitor and assess vital signs and perfusion

## 2021-09-27 LAB — BASIC METABOLIC PANEL
Anion Gap: 13.6 mMol/L (ref 7.0–18.0)
BUN / Creatinine Ratio: 9.2 Ratio — ABNORMAL LOW (ref 10.0–30.0)
BUN: 6 mg/dL — ABNORMAL LOW (ref 7–22)
CO2: 18 mMol/L — ABNORMAL LOW (ref 20–30)
Calcium: 8.8 mg/dL (ref 8.5–10.5)
Chloride: 109 mMol/L (ref 98–110)
Creatinine: 0.65 mg/dL (ref 0.60–1.20)
EGFR: 128 mL/min/{1.73_m2} (ref 60–150)
Glucose: 73 mg/dL (ref 71–99)
Osmolality Calculated: 270 mOsm/kg — ABNORMAL LOW (ref 275–300)
Potassium: 3.6 mMol/L (ref 3.5–5.3)
Sodium: 137 mMol/L (ref 136–147)

## 2021-09-27 LAB — MAGNESIUM: Magnesium: 1.7 mg/dL (ref 1.6–2.6)

## 2021-09-27 MED ORDER — POTASSIUM CHLORIDE 20 MEQ/15ML (10%) PO SOLN
40.0000 meq | Freq: Every day | ORAL | 0 refills | Status: AC
Start: 2021-09-27 — End: 2021-09-30

## 2021-09-27 MED ORDER — VH MAGNESIUM SULFATE 2 G IN 50 ML IV PREMIX
2.0000 g | Freq: Once | INTRAVENOUS | Status: AC
Start: 2021-09-27 — End: 2021-09-27
  Administered 2021-09-27: 2 g via INTRAVENOUS
  Filled 2021-09-27 (×2): qty 50

## 2021-09-27 NOTE — Plan of Care (Signed)
NURSE NOTE SUMMARY  [facility] MEDICAL OVERFLOW   Patient Name: Kim Howe   Attending Physician: Mir, Judd Lien, MD   Today's date:   09/27/2021 LOS: 0 days   Shift Summary:                                                              0700 - Assumed care of the patient.   11:25 - This patient was seen and evaluated by the physician and deemed appropriate for discharge. Discharge order in place, packet completed. Pt received discharge packet, discharge education given to pt; pt verbalized understanding and denies any questions, comments, or concerns at this time. IV removed, catheter fully intact. Pt tolerated IV removal well. Pt denies any needs at this time.      Provider Notifications:        Rapid Response Notifications:  Mobility:      PMP Activity: Step 6 - Walks in Room (09/27/2021 11:00 AM)     Weight tracking:  Family Dynamic:   Last 3 Weights for the past 72 hrs (Last 3 readings):   Weight   09/25/21 2300 68.1 kg (150 lb 1.6 oz)   09/25/21 1755 72.6 kg (160 lb 0.9 oz)             Last Bowel Movement   No data recorded        Problem: Safety  Goal: Patient will be free from injury during hospitalization  Outcome: Progressing  Goal: Patient will be free from infection during hospitalization  Outcome: Progressing     Problem: Pain  Goal: Pain at adequate level as identified by patient  Outcome: Progressing     Problem: Side Effects from Pain Analgesia  Goal: Patient will experience minimal side effects of analgesic therapy  Outcome: Progressing     Problem: Fluid and Electrolyte Imbalance/ Endocrine  Goal: Fluid and electrolyte balance are achieved/maintained  Outcome: Progressing  Goal: Adequate hydration  Outcome: Progressing     Problem: Nutrition  Goal: Nutritional intake is adequate  Outcome: Progressing  Goal: Patient maintains weight  Outcome: Progressing  Goal: Food and/or nutrient delivery  Outcome: Progressing

## 2021-09-27 NOTE — Discharge Summary (Signed)
Medicine Discharge Summary   [facility]  [employer]   Patient Name: Kim Howe   Attending Physician: Tremell Reimers, Judd Lien, MD PCP: Marisa Sprinkles, MD   Date of Admission: 09/25/2021 D/C Date: 09/27/21   Discharge Diagnoses:     Hyperemesis gravidarum (7 weeks)  With Superimposed Cannabinoids hyperemesis syndrome   Acute severe hypokalemia  Metabolic acidosis  [redacted] weeks gestation  Idiopathic intracranial hypertension s/p shunt     Hospital Course       Kim Howe is a 20 y.o. female patient with past medical history of mood disorder, idiopathic intracranial hypertension status post shunt presents to the ED from home with chief complaint of nausea vomiting.  Patient is [redacted] weeks pregnant.  Of note patient was recently discharged with K supplement and Zofran  from ED on 09/20/2021 for similar presentation and was diagnosed with possible cannabis related nausea vomiting.  Patient was known to be pregnant then as well.     Admitted with inability to tolerate PO, K 2.6, metabolic acidosis  likely from hyperemesis gravidum which as unresponsive to o/p zofran. Ob consulted.  Electrolytes were aggressively repleted with multiple IV repletion n.p.o.  Symptoms improved with as needed IV Zofran, vitamin B6 and Phenergan.  U tox was + cannabinoids , Symptoms likely combination of  both hyperemesis from cannabinoid use and pregnancy.   Pt was able to eat toast and milk today. Electrolytes improved.  Diclegic ordered by Melrose Nakayama.Dr.Kula also rescheduled her o/p Ob appointment earlier in a week after discharge.   Pt did not have any concerning signs of vision changes.headache through hospital stay.     Principal Problem:    Hyperemesis gravidarum  Resolved Problems:    * No resolved hospital problems. *    Pending Results and other significant studies:  None     Discharge Instructions:          Disposition: Home  Diet: Regular Diet  Activity: As tolerated  Discharge Code Status: Full Code    [facility]  OBSTETRICS & GYNECOLOGY, [facility]  [address]  [place] [address]  [phone]  Go to  Your hospital follow-up visit is for Monday November 14 at 10:45. PLEASE call the office if you need to change or cancel this appointment.       Discharge Medications:                                                                        Discharge Medication List        Taking      doxylamine-pyridoxine 10-10 MG Tbec  Dose: 2 tablet  Commonly known as: DICLEGIS  Take 2 tablets by mouth nightly Two tablets at bedtime on day 1 and 2; if symptoms persist, take 1 tablet in morning and 2 tablets at bedtime on day 3; if symptoms persist, may increase to 1 tablet in morning, 1 tablet mid-afternoon, and 2 tablets at bedtime on day 4 (maximum: doxylamine 40 mg/pyridoxine 40 mg (4 tablets) per day).     potassium chloride 20 MEQ/15ML (10%) oral solution  Dose: 40 mEq  Take 30 mLs (40 mEq) by mouth daily for 3 days  STOP taking these medications      meclizine 25 MG tablet  Commonly known as: ANTIVERT     potassium chloride 20 MEQ tablet  Commonly known as: KLOR-CON               Discharge Day Exam (09/27/2021):     Blood pressure 103/57, pulse 60, temperature 97.5 F (36.4 C), temperature source Temporal, resp. rate 16, height 1.6 m (5' 2.99"), weight 68.1 kg (150 lb 1.6 oz), SpO2 96 %.         General: Patient is awake. In no acute distress.  Chest: CTA bilaterally. No rhonchi, no wheezing. No use of accessory muscles.  CVS: Normal rate and regular rhythm no murmurs, without JVD.  Abdomen: Soft, non-tender, no guarding or rigidity, with normal bowel sounds.  Extremities: No pitting edema, pulses palpable, no calf swelling and no gross deformity.  Skin: Warm, dry  NEURO: No motor or sensory deficits.     Recent Labs      Recent Labs   Lab 09/25/21  1805   WBC 7.5   RBC 5.34*   Hemoglobin 15.7   Hematocrit 47.0   MCV 88   PLT CT 349     Recent Labs   Lab 09/25/21  1800   PT 10.9   PT INR 1.0         No  results found for: HGBA1CPERCNT  Recent Labs   Lab 09/27/21  0453 09/26/21  1513 09/26/21  0452 09/25/21  1805   Glucose 73 78 65* 89   Sodium 137 140 137 140   Potassium 3.6 3.2* 2.8* 2.6*   Chloride 109 106 105 100   CO2 18* 19* 16* 23   BUN 6* 7 6* 8   Creatinine 0.65 0.63 0.66 0.75   EGFR 128 129 128 116   Calcium 8.8 9.4 9.0 10.1     Recent Labs   Lab 09/27/21  0453 09/26/21  0452 09/25/21  1805   Magnesium 1.7 1.8 1.9   Phosphorus  --  3.8  --    Albumin  --  4.0 4.9   Protein, Total  --  7.0 8.8*   Bilirubin, Total  --  0.7 0.8   Alkaline Phosphatase  --  53 64   ALT  --  10 12   AST (SGOT)  --  14 17        Allergies:      Other   Time spent on discharging the patient:  35 minutes   US OB < 14 Weeks    Result Date: 09/20/2021  Unremarkable 6 week 3 day intrauterine pregnancy. ReadingStation:[email or web address]    Home Health Needs:  There are no questions and answers to display.      Judd Lien Carvin Almas, MD         09/27/21 10:54 AM   MRN: [id number]                                      CSN: [id number] DOB: 2001/08/30

## 2021-09-27 NOTE — Plan of Care (Addendum)
NURSE NOTE SUMMARY  [facility] MEDICAL OVERFLOW   Patient Name: Kim Howe   Attending Physician: Mir, Judd Lien, MD   Today's date:   09/27/2021 LOS: 0 days   Shift Summary:                                                              1900 Assume care at this time.  2030 assessment completed and charted.  Patient alert and oriented x 4.  Independent, up  walking to the bathroom.  On room air, breathing normal, bilateral lungs sounds clear. VSS.  IV to left AC patent.  Dressing dry, clean and intact, flushes okay with no blood return.  No complaint of pain, nor discomfort noted.  Boyfriend to bedside.  Bed locked, placed  to low position, bed alarm on, call light within reach.   0000 Refused phenergan   Provider Notifications:        Rapid Response Notifications:  Mobility:      PMP Activity: Step 6 - Walks in Room (09/27/2021  6:00 AM)     Weight tracking:  Family Dynamic:   Last 3 Weights for the past 72 hrs (Last 3 readings):   Weight   09/25/21 2300 68.1 kg (150 lb 1.6 oz)   09/25/21 1755 72.6 kg (160 lb 0.9 oz)             Last Bowel Movement   No data recorded       Problem: Safety  Goal: Patient will be free from injury during hospitalization  Description: Interventions:  1. Assess patient's risk for falls and implement fall prevention plan of care per policy      2. Provide and maintain safe environment   3. Use appropriate transfer methods   4. Ensure appropriate safety devices are available at the bedside   5. Include patient/ family/ care giver in decisions related to safety   6. Hourly rounding   7. Assess for patients risk for elopement and implement Elopement Risk Plan per policy   8. Provide alternative method of communication if needed (communication boards, writing)  Outcome: Progressing  Goal: Patient will be free from infection during hospitalization  Description: Interventions:  1. Assess and monitor for signs and symptoms of infection  2. Monitor  lab/diagnostic results  3. Monitor all insertion sites (i.e. indwelling lines, tubes, urinary catheters, and drains)  4. Encourage patient and family to use good hand hygiene technique  Outcome: Progressing     Problem: Pain  Goal: Pain at adequate level as identified by patient  Description: Interventions:  1. Identify patient comfort function goal  2. Evaluate if patient comfort function goal is met  3. Assess pain on admission, during daily assessment and/or before any "as needed" intervention(s)  4. Reassess pain within 30-60 minutes of any procedure/intervention, per Pain Assessment, Intervention, Reassessment (AIR) Cycle  5. Evaluate patient's satisfaction with pain management progress  6. Offer non-pharmacological pain management interventions  7. Consult/collaborate with Pain Service  8. Consult/collaborate with Physical Therapy, Occupational Therapy, and/or Speech Therapy  9. Assess for risk of opioid induced respiratory depression and side effects, including snoring/sleep apnea. Alert healthcare team of risk factors identified.  10. Include patient/patient care companion in decisions related to pain management as needed  Outcome: Progressing     Problem: Side Effects from Pain Analgesia  Goal: Patient will experience minimal side effects of analgesic therapy  Description: Interventions:  1. Monitor/assess patient's respiratory status (RR depth, effort, breath sounds)  2. Assess for changes in cognitive function   3. Prevent/manage side effects per LIP orders (i.e. nausea, vomiting, pruritus, constipation, urinary retention, etc.)  4. Evaluate for opioid-induced sedation with appropriate assessment tool (i.e. POSS)  Outcome: Progressing     Problem: Fluid and Electrolyte Imbalance/ Endocrine  Goal: Fluid and electrolyte balance are achieved/maintained  Description: Interventions:  1. Monitor intake and output every shift  2. Monitor/assess lab values and report abnormal values  3. Provide adequate  hydration  4. Assess for confusion/personality changes  5. Monitor daily weight  6. Assess and reassess fluid and electrolyte status  7. Observe for seizure activity and initiate seizure precautions if indicated  8. Observe for cardiac arrhythmias  9. Monitor for muscle weakness  Outcome: Progressing  Goal: Adequate hydration  Description: Interventions:  1. Assess mucus membranes, skin color, turgor, perfusion and presence of edema  2. Assess for peripheral, sacral, periorbital and abdominal edema  3. Monitor and assess vital signs and perfusion  Outcome: Progressing     Problem: Nutrition  Goal: Nutritional intake is adequate  Description: Interventions:  1. Monitor daily weights  2. Assist patient with meals/food selection  3. Allow adequate time for meals  4. Encourage/perform oral hygiene as appropriate   5. Encourage/administer dietary supplements as ordered (i.e. tube feed, TPN, oral, OGT/NGT, supplements)  6. Consult/collaborate with Clinical Nutritionist  7. Include patient/patient care companion in decisions related to nutrition  8. Assess anorexia, appetite, and amount of meal/food tolerated  9. Consult/collaborate with Speech Therapy (swallow evaluations)  Outcome: Progressing  Goal: Patient maintains weight  Description: Interventions:  1. Monitor calorie intake   2. Monitor consistency/variety of food  3. Monitor weight  Outcome: Progressing  Goal: Food and/or nutrient delivery  Description: Interventions:  1. Enteral nutrition  2. Parenteral nutrition/IV fluids   3. Feeding assistance   4. Nutrition related medication management   5. Provide a pleasant environment during mealtime  Outcome: Progressing

## 2021-10-01 LAB — HEPATITIS B SURFACE ANTIGEN W/ REFLEX TO CONFIRMATION: Hepatitis B Surface Antigen: NEGATIVE

## 2021-10-01 LAB — RPR: RPR: NONREACTIVE

## 2021-10-01 LAB — HIV AG/AB 4TH GENERATION: HIV Ag/Ab, 4th Generation: NONREACTIVE

## 2021-10-01 LAB — VH STD AMPLIFIED DNA PROBE
Chlamydia trachomatis: NEGATIVE
Neisseria gonorrhoeae: NEGATIVE

## 2021-10-01 LAB — VH RUBELLA SCREEN, SERUM: Rubella IgG: IMMUNE

## 2021-10-01 LAB — HEPATITIS C ANTIBODY: Hepatitis C Antibody: NEGATIVE

## 2021-10-24 ENCOUNTER — Telehealth: Payer: Self-pay

## 2021-10-24 NOTE — Telephone Encounter (Signed)
This liaison placed a follow-up call to patient per referral from her OB / GYN provider for perinatal behavioral health services.  Contact information provided in a voice message.  Will follow-up on 10/28/2021.

## 2021-10-25 ENCOUNTER — Encounter: Payer: Self-pay | Admitting: Maternal & Fetal Medicine

## 2021-10-28 ENCOUNTER — Telehealth: Payer: Self-pay

## 2021-10-28 NOTE — Telephone Encounter (Signed)
This liaison placed a second follow-up call to patient per referral from her OB / GYN provider for perinatal behavioral health services.  Contact information provided in a voice message and secure email.

## 2021-11-01 ENCOUNTER — Encounter: Payer: Self-pay | Admitting: Maternal & Fetal Medicine

## 2021-11-14 ENCOUNTER — Encounter: Payer: Self-pay | Admitting: Women's Health

## 2021-11-14 DIAGNOSIS — Z3490 Encounter for supervision of normal pregnancy, unspecified, unspecified trimester: Secondary | ICD-10-CM

## 2021-11-15 ENCOUNTER — Ambulatory Visit: Payer: BC Managed Care – PPO

## 2021-11-15 ENCOUNTER — Other Ambulatory Visit
Admission: RE | Admit: 2021-11-15 | Discharge: 2021-11-15 | Disposition: A | Discharge status: Home / Self Care | Payer: BC Managed Care – PPO | Source: Ambulatory Visit | Attending: Maternal & Fetal Medicine | Admitting: Maternal & Fetal Medicine

## 2021-11-15 ENCOUNTER — Ambulatory Visit
Admission: RE | Admit: 2021-11-15 | Discharge: 2021-11-15 | Disposition: A | Discharge status: Home / Self Care | Payer: BC Managed Care – PPO | Source: Ambulatory Visit | Attending: Women's Health | Admitting: Women's Health

## 2021-11-15 ENCOUNTER — Other Ambulatory Visit
Admission: RE | Admit: 2021-11-15 | Discharge: 2021-11-15 | Disposition: A | Discharge status: Home / Self Care | Payer: BC Managed Care – PPO | Source: Ambulatory Visit

## 2021-11-15 DIAGNOSIS — O10012 Pre-existing essential hypertension complicating pregnancy, second trimester: Secondary | ICD-10-CM | POA: Insufficient documentation

## 2021-11-15 DIAGNOSIS — Z3490 Encounter for supervision of normal pregnancy, unspecified, unspecified trimester: Secondary | ICD-10-CM

## 2021-11-15 DIAGNOSIS — Z3A14 14 weeks gestation of pregnancy: Secondary | ICD-10-CM | POA: Insufficient documentation

## 2021-11-15 DIAGNOSIS — O99340 Other mental disorders complicating pregnancy, unspecified trimester: Secondary | ICD-10-CM | POA: Insufficient documentation

## 2021-11-15 LAB — HEMOGLOBIN A1C
Estimated Average Glucose: 91 mg/dL
Hgb A1C, %: 4.8 %

## 2021-11-18 LAB — VH REFERENCE LAB TEST

## 2021-11-26 ENCOUNTER — Telehealth: Payer: Self-pay

## 2021-11-26 NOTE — Telephone Encounter (Signed)
This liaison placed a follow-up call to patient per this second referral from her OB / GYN provider.   Information on perinatal behavioral health services was provided to patient.   Patient shared that she has had symptoms of depression and anxiety for years.  She shared that her anxiety has been worse with this pregnancy.  She is often worried about the health of her pregnancy and baby.  She shared that she feels anxious, depressed, avoids people, is tired, has low motivation, feels irritable, and cries easily.  She struggles to stay asleep through the night, but this was a problem before her pregnancy.  She does experience panic attacks including feeling like she can't catch her breath, headaches, and heart racing.  She works full time and often rests or sleeps when she has a day off.  She does have times when she experiences symptoms of mania including feeling full of energy, like she is on top of the world, not needing to sleep, and starting new projects.  She shared that these symptoms only last a couple of days.  She talks with her boyfriend, reads books, listens to audio books, and watches movies to help her relax.  She does have a family history of mental illness including bipolar disorder, anxiety, and depression.  She has participated in counseling and medication management.  She shared that she was tried on different medications, but she did not like how they made her feel.  She is not currently taking medication and is not interested in considering medication at this time.  She is interested in counseling.  Discussed the benefits of deep breathing as part of her self-care routine.  She has the support of her boyfriend, friend, and aunt.     Patient shared that she is not having thoughts to harm herself or others.  She verbally committed to safety, to talking with her supports, and to going to the nearest ED if she did have these thoughts.  Patient shared that she is smoking cigarettes.  She has cut back  to about 3-4 cigarettes per day.  She desires to stop smoking.  Discussed the effects of nicotine.  She shared that she is not drinking alcohol and has not smoked marijuana in the past two months.       In discussing these concerns, we agreed that she would benefit from a referral for counseling.  This liaison sent referral information for [facility]  to patient in an email.  The following was also sent to patient:  Postpartum Support online support group and warm line number, postpartum depression and anxiety information, [facility], [facility], and local concern line numbers.     Patient agreed to contact this liaison with any mental health referral questions or concerns.  In total, 45  minutes of personal time was spent with patient.

## 2021-12-20 ENCOUNTER — Encounter: Payer: Self-pay | Admitting: Women's Health

## 2021-12-20 DIAGNOSIS — Z3490 Encounter for supervision of normal pregnancy, unspecified, unspecified trimester: Secondary | ICD-10-CM

## 2021-12-23 ENCOUNTER — Ambulatory Visit
Admission: RE | Admit: 2021-12-23 | Discharge: 2021-12-23 | Disposition: A | Discharge status: Home / Self Care | Payer: BC Managed Care – PPO | Source: Ambulatory Visit | Attending: Women's Health | Admitting: Women's Health

## 2021-12-23 DIAGNOSIS — O10012 Pre-existing essential hypertension complicating pregnancy, second trimester: Secondary | ICD-10-CM | POA: Insufficient documentation

## 2021-12-23 DIAGNOSIS — Z982 Presence of cerebrospinal fluid drainage device: Secondary | ICD-10-CM | POA: Insufficient documentation

## 2021-12-23 DIAGNOSIS — F32A Depression, unspecified: Secondary | ICD-10-CM | POA: Insufficient documentation

## 2021-12-23 DIAGNOSIS — O99282 Endocrine, nutritional and metabolic diseases complicating pregnancy, second trimester: Secondary | ICD-10-CM | POA: Insufficient documentation

## 2021-12-23 DIAGNOSIS — E282 Polycystic ovarian syndrome: Secondary | ICD-10-CM | POA: Insufficient documentation

## 2021-12-23 DIAGNOSIS — Z3490 Encounter for supervision of normal pregnancy, unspecified, unspecified trimester: Secondary | ICD-10-CM

## 2021-12-23 DIAGNOSIS — Z3686 Encounter for antenatal screening for cervical length: Secondary | ICD-10-CM | POA: Insufficient documentation

## 2021-12-23 DIAGNOSIS — O99342 Other mental disorders complicating pregnancy, second trimester: Secondary | ICD-10-CM | POA: Insufficient documentation

## 2021-12-23 DIAGNOSIS — F419 Anxiety disorder, unspecified: Secondary | ICD-10-CM | POA: Insufficient documentation

## 2021-12-23 DIAGNOSIS — Z3A19 19 weeks gestation of pregnancy: Secondary | ICD-10-CM | POA: Insufficient documentation

## 2022-02-04 ENCOUNTER — Emergency Department
Admission: EM | Admit: 2022-02-04 | Discharge: 2022-02-04 | Disposition: A | Discharge status: Home / Self Care | Payer: BC Managed Care – PPO | Source: Ambulatory Visit | Attending: Obstetrics and Gynecology | Admitting: Obstetrics and Gynecology

## 2022-02-04 ENCOUNTER — Emergency Department
Admission: EM | Admit: 2022-02-04 | Payer: BC Managed Care – PPO | Source: Ambulatory Visit | Admitting: Obstetrics and Gynecology

## 2022-02-04 DIAGNOSIS — Z3A26 26 weeks gestation of pregnancy: Secondary | ICD-10-CM | POA: Insufficient documentation

## 2022-02-04 DIAGNOSIS — O368120 Decreased fetal movements, second trimester, not applicable or unspecified: Secondary | ICD-10-CM | POA: Insufficient documentation

## 2022-02-04 NOTE — OB ED Provider Note (Signed)
OBED Triage note     Assessment & Plan    Kim Howe 21 y.o. G1P0  [redacted]w[redacted]d Estimated Date of Delivery: 05/13/22    Presenting due to decrease fetal movement states usually feels movement many times a day and has only felt a few movements in past few days.        FHT appropriate at 140-160  Toco none noted       Plan  1-Educated on Fetal kick count 5/1 and 10/2 as well as education of infant sleep cycles   2-Follow up with primary OB as scheduled     CC  Decreased fetal movement    HPI  Concerns related to decreased fetal movement notes infant moves a lot usually and noted no movement over past few days.  Not having any VB, LOF, or contraction like discomfort       OB History   Gravida Para Term Preterm AB Living   1 0 0 0 0 0   SAB IAB Ectopic Multiple Live Births   0 0 0 0 0      Past Medical History:   Diagnosis Date    Anxiety     Depression     Intracranial hypertension     PCOS (polycystic ovarian syndrome)       Past Surgical History:   Procedure Laterality Date    BRAIN SURGERY      brain shunt 3/20          BP 115/64   Pulse 86   Resp 15   Ht 1.575 m (5\' 2" )   Wt 80.7 kg (178 lb)   BMI 32.56 kg/m   Gen NAD  CV RR  Resp Non labored   Abd appropriate for GA  Ext Negative LE edema

## 2022-02-04 NOTE — Discharge Instr - AVS First Page (Signed)
Call your OB Provider, or return to the hospital, if you have any of the following signs:     If more than 36 weeks, regular painful contractions every 3-5 minutes for one hour that increase in strength.     If less than 36 weeks, 6 or more contractions in an hour.     Persistent nausea, vomiting or diarrhea; accompanied by the inability to keep liquids down.     Leaking of amniotic fluid, or water breaking; this can be a gush or small trickle.     Vaginal bleeding, especially any bright red bleeding like a period or passing clots (it is normal to have spotting after vaginal exam or intercourse).     Concern about a decrease in your baby's movements.     Temperature greater than 100.4(F) orally.     Severe headache which is not relieved 60 minutes after taking Tylenol (Acetaminophen); Blurry vision or spots before your eyes, not just when you suddenly stand up; Severe heartburn or pain on the upper rights side of your abdomen that is not relieved by an antacid; Sudden increase in swelling in your face, hands, or feet.

## 2022-03-12 ENCOUNTER — Encounter: Payer: Self-pay | Admitting: Women's Health

## 2022-03-12 DIAGNOSIS — Z3490 Encounter for supervision of normal pregnancy, unspecified, unspecified trimester: Secondary | ICD-10-CM

## 2022-03-17 ENCOUNTER — Ambulatory Visit
Admission: RE | Admit: 2022-03-17 | Discharge: 2022-03-17 | Disposition: A | Discharge status: Home / Self Care | Payer: BC Managed Care – PPO | Source: Ambulatory Visit | Attending: Women's Health | Admitting: Women's Health

## 2022-03-17 DIAGNOSIS — F32A Depression, unspecified: Secondary | ICD-10-CM | POA: Insufficient documentation

## 2022-03-17 DIAGNOSIS — Z3A31 31 weeks gestation of pregnancy: Secondary | ICD-10-CM | POA: Insufficient documentation

## 2022-03-17 DIAGNOSIS — G932 Benign intracranial hypertension: Secondary | ICD-10-CM | POA: Insufficient documentation

## 2022-03-17 DIAGNOSIS — O99283 Endocrine, nutritional and metabolic diseases complicating pregnancy, third trimester: Secondary | ICD-10-CM | POA: Insufficient documentation

## 2022-03-17 DIAGNOSIS — O10013 Pre-existing essential hypertension complicating pregnancy, third trimester: Secondary | ICD-10-CM | POA: Insufficient documentation

## 2022-03-17 DIAGNOSIS — O99343 Other mental disorders complicating pregnancy, third trimester: Secondary | ICD-10-CM | POA: Insufficient documentation

## 2022-03-17 DIAGNOSIS — Z982 Presence of cerebrospinal fluid drainage device: Secondary | ICD-10-CM | POA: Insufficient documentation

## 2022-03-17 DIAGNOSIS — Z3490 Encounter for supervision of normal pregnancy, unspecified, unspecified trimester: Secondary | ICD-10-CM

## 2022-03-17 DIAGNOSIS — F419 Anxiety disorder, unspecified: Secondary | ICD-10-CM | POA: Insufficient documentation

## 2022-03-17 DIAGNOSIS — E282 Polycystic ovarian syndrome: Secondary | ICD-10-CM | POA: Insufficient documentation

## 2022-04-24 LAB — GROUP B STREP TRANSCRIBED: GBS Transcribed: POSITIVE

## 2022-05-08 ENCOUNTER — Ambulatory Visit
Admission: RE | Admit: 2022-05-08 | Discharge: 2022-05-08 | Disposition: A | Discharge status: Home / Self Care | Payer: BC Managed Care – PPO | Source: Ambulatory Visit | Attending: Obstetrics & Gynecology | Admitting: Obstetrics & Gynecology

## 2022-05-08 DIAGNOSIS — D509 Iron deficiency anemia, unspecified: Secondary | ICD-10-CM | POA: Insufficient documentation

## 2022-05-08 MED ORDER — SODIUM CHLORIDE 0.9 % IV SOLN
300.0000 mg | Freq: Once | INTRAVENOUS | Status: AC
Start: 2022-05-08 — End: 2022-05-08
  Administered 2022-05-08: 300 mg via INTRAVENOUS
  Filled 2022-05-08: qty 15

## 2022-05-08 NOTE — Nursing Progress Note (Signed)
Pt. To OPI for scheduled iron infusion. PIV placed in LAC. Iron infused over 90 mins. Pt. Tolerated well with no signs or symptoms of adverse reaction. PIV removed at infusion end.

## 2022-05-09 ENCOUNTER — Emergency Department
Admission: EM | Admit: 2022-05-09 | Discharge: 2022-05-10 | Disposition: A | Discharge status: Home / Self Care | Payer: BC Managed Care – PPO | Source: Ambulatory Visit | Attending: Obstetrics & Gynecology | Admitting: Obstetrics & Gynecology

## 2022-05-09 ENCOUNTER — Encounter: Payer: Self-pay | Admitting: Obstetrics & Gynecology

## 2022-05-09 DIAGNOSIS — O471 False labor at or after 37 completed weeks of gestation: Secondary | ICD-10-CM | POA: Insufficient documentation

## 2022-05-09 DIAGNOSIS — R109 Unspecified abdominal pain: Secondary | ICD-10-CM | POA: Insufficient documentation

## 2022-05-09 DIAGNOSIS — R102 Pelvic and perineal pain: Secondary | ICD-10-CM | POA: Insufficient documentation

## 2022-05-09 DIAGNOSIS — M549 Dorsalgia, unspecified: Secondary | ICD-10-CM | POA: Insufficient documentation

## 2022-05-09 DIAGNOSIS — F17210 Nicotine dependence, cigarettes, uncomplicated: Secondary | ICD-10-CM | POA: Insufficient documentation

## 2022-05-09 DIAGNOSIS — Z3A39 39 weeks gestation of pregnancy: Secondary | ICD-10-CM | POA: Insufficient documentation

## 2022-05-09 DIAGNOSIS — R110 Nausea: Secondary | ICD-10-CM | POA: Insufficient documentation

## 2022-05-09 DIAGNOSIS — O99333 Smoking (tobacco) complicating pregnancy, third trimester: Secondary | ICD-10-CM | POA: Insufficient documentation

## 2022-05-09 DIAGNOSIS — R197 Diarrhea, unspecified: Secondary | ICD-10-CM | POA: Insufficient documentation

## 2022-05-09 DIAGNOSIS — K5900 Constipation, unspecified: Secondary | ICD-10-CM | POA: Insufficient documentation

## 2022-05-09 LAB — VH URINE DRUG SCREEN - NO CONFIRMATION
Propoxyphene: NEGATIVE
Urine Amphetamine: NEGATIVE
Urine Barbiturates: NEGATIVE
Urine Benzodiazepines: NEGATIVE
Urine Buprenorphine: NEGATIVE
Urine Cannabinoids: POSITIVE — AB
Urine Cocaine: NEGATIVE
Urine Creatinine Random: 134 mg/dL
Urine Ecstasy Screen: NEGATIVE
Urine Fentanyl Screen: NEGATIVE
Urine Methadone Screen: NEGATIVE
Urine Opiates: NEGATIVE
Urine Oxycodone: NEGATIVE
Urine Phencyclidine: NEGATIVE
Urine Specific Gravity: 1.015 (ref 1.001–1.040)
pH, Urine: 5.9 pH (ref 5.0–8.0)

## 2022-05-09 NOTE — OB ED Provider Note (Signed)
OBSTETRICAL EMERGENCY DEPARTMENT   NOTE    Date Time: 05/09/22 11:47 PM  Patient Name: Kim Howe      Chief Complaint   Patient presents with    Contractions         History of Present Illness:     Kim Howe is a 21 y.o. G1P0 female with a gestational age of 110w3d and Estimated Date of Delivery: 05/13/22 who presents to the hospital for: Contractions.    Kim Howe reports feeling ctxs that are really bad.  Feels them in her lower abd to lower back.  Also with pelvic pressure all day.  Ctxs started at 6 pm.  Feeling them q25min or so.  Rates pain 6-7/10.  Pain characterized as back achiness and sometimes sharp, abd cramping and stabbing.  Denies vag bleeding or leaking fluid.  Reports normal fetal movement.  No other complaints.      Her prenatal care is with [facility].  Prenatal records reviewed.  Pregnancy notable for:  Anxiety/depression  Benign intracranial hypertension  Anemia - got IV iron yesterday, also on B12 shots  GBS pos  Marijuana use - last 3 wks ago      Review of Systems:     Review of Systems   Constitutional:  Negative for fever.   HENT:  Positive for congestion.    Eyes:  Negative for visual disturbance.   Gastrointestinal:  Positive for abdominal pain, constipation, diarrhea and nausea. Negative for vomiting.   Genitourinary:  Positive for pelvic pain. Negative for dysuria, vaginal bleeding and vaginal discharge.   Musculoskeletal:  Positive for back pain.   Allergic/Immunologic: Positive for environmental allergies.   Neurological:  Negative for headaches.         Medications:     Medications Prior to Admission   Medication Sig Dispense Refill Last Dose    Prenatal MV-Min-Fe Fum-FA-DHA (PRENATAL 1 PO) Take by mouth   Past Week    doxylamine-pyridoxine (DICLEGIS) 10-10 MG Tablet Delayed Response Take 2 tablets by mouth nightly Two tablets at bedtime on day 1 and 2; if symptoms persist, take 1 tablet in morning and 2 tablets at bedtime on day 3; if  symptoms persist, may increase to 1 tablet in morning, 1 tablet mid-afternoon, and 2 tablets at bedtime on day 4 (maximum: doxylamine 40 mg/pyridoxine 40 mg (4 tablets) per day). 30 tablet 0 More than a month         Allergies   Allergen Reactions    Other      Mint           OB History   Gravida Para Term Preterm AB Living   1             SAB IAB Ectopic Multiple Live Births                  # Outcome Date GA Lbr Len/2nd Weight Sex Delivery Anes PTL Lv   1 Current                Past Medical History:   Diagnosis Date    Anxiety     Depression     Intracranial hypertension     PCOS (polycystic ovarian syndrome)          Past Surgical History:   Procedure Laterality Date    BRAIN SURGERY      brain shunt 3/20       She  reports that she has been smoking cigarettes.  She has a 1.20 pack-year smoking history. She has never used smokeless tobacco. She reports that she does not currently use alcohol. She reports that she does not currently use drugs after having used the following drugs: Marijuana.        Physical Exam:     Vitals:    05/09/22 2224   BP: 117/71   Pulse: 77   Resp: 16   Temp: 98.1 F (36.7 C)    Body mass index is 33.16 kg/m.    General: Alert and oriented, no acute distress; appears comfortable  Mental status: mood appropriate  Respiratory: nonlabored breathing   Abdomen: soft, gravid, mildly tender to deep palp midline lower abd; nondistended, no masses or organomegaly  Musculoskeletal: 1+ pedal edema, good ROM, no calf tenderness  Neurologic/Psychiatric: grossly intact, normal mood, behavior, speech, and thought processes.  Integumentary: normal coloration and turgor, no rashes, no suspicious skin lesions noted  Genitourinary:      Pelvic:    Last Cervical Exam:  Dilation: .5 (05/09/22 2224)  Effacement (%): 30 (05/09/22 2224)  Station: Ballotable (05/09/22 2224)  OB Examiner: Kim Howe, T RN (05/09/22 2224)    NST:   NST reviewed:  Fetal Heart: baseline 110, mod var, accels present, no decels  TOCO:  occasional ctxs    Labs:     Results       Procedure Component Value Units Date/Time    Urine Drug Screen - No Confirmation [[id number]]  (Abnormal) Collected: 05/09/22 2230    Specimen: Urine, Random Updated: 05/09/22 2320     Urine Creatinine Random 134 mg/dL      pH, Urine 5.9 pH      Urine Specific Gravity 1.015     Urine Amphetamine Negative     Urine Barbiturates Negative     Urine Benzodiazepines Negative     Urine Cannabinoids Presumptive Positive     Urine Cocaine Negative     Urine Methadone Screen Negative     Urine Opiates Negative     Urine Oxycodone Negative     Urine Phencyclidine Negative     Propoxyphene Negative     Urine Buprenorphine Negative     Urine Ecstasy Screen Negative     Urine Fentanyl Screen Negative    Narrative:      This is a screening test only.  Any presumptive positive result should be confirmed by more definitive methodologies. Any unconfirmed results should be used for medical purposes only.  Samples with creatinine concentrations <20 mg/dl are considered suspect and resubmission is recommended.  The following thresholds are the minimal levels for detection:  Amphetamine    1000 ng/mL  Methadone     300 ng/mL  Barbiturate     200 ng/mL  Opiate        300 ng/mL  Benzodiazepine  200 ng/mL  Oxycodone     100 ng/mL  Cannabinoid      50 ng/mL  Phenyclidine   25 ng/mL  Cocaine         300 ng/mL  Propoxyphene  300 ng/mL  Buprenorphine     5 ng/mL  Ecstasy       500 ng/mL  Fentanyl          2 ng/mL              Assessment:   21 y.o. G1P0 @ [redacted]w[redacted]d with false labor  FHT cat I    Plan:     OK for discharge home  Discussed comfort measures  Discussed when to return to OBED      Signed by: Glendale ChardKatherine R Daryle Amis, MD

## 2022-05-09 NOTE — Discharge Instr - AVS First Page (Signed)
Call your OB Provider, or return to the hospital, if you have any of the following signs:     If more than 36 weeks, regular painful contractions every 3-5 minutes for one hour that increase in strength.     If less than 36 weeks, 6 or more contractions in an hour.     Persistent nausea, vomiting or diarrhea; accompanied by the inability to keep liquids down.     Leaking of amniotic fluid, or water breaking; this can be a gush or small trickle.     Vaginal bleeding, especially any bright red bleeding like a period or passing clots (it is normal to have spotting after vaginal exam or intercourse).     Concern about a decrease in your baby's movements.     Temperature greater than 100.4(F) orally.     Severe headache which is not relieved 60 minutes after taking Tylenol (Acetaminophen); Blurry vision or spots before your eyes, not just when you suddenly stand up; Severe heartburn or pain on the upper rights side of your abdomen that is not relieved by an antacid; Sudden increase in swelling in your face, hands, or feet.

## 2022-05-10 NOTE — Procedures (Signed)
NST Note    Indication: Ctxs, abd pain  Time on the monitor: 1 hr 16 min  Baseline: 110  Variability: moderate  Decels: none  Accels: present  Interpretation: Reactive/Category 1  Toco: occasional

## 2022-05-12 ENCOUNTER — Emergency Department
Admission: EM | Admit: 2022-05-12 | Discharge: 2022-05-12 | Disposition: A | Discharge status: Home / Self Care | Payer: BC Managed Care – PPO | Attending: Obstetrics and Gynecology | Admitting: Obstetrics and Gynecology

## 2022-05-12 ENCOUNTER — Encounter: Payer: Self-pay | Admitting: Obstetrics and Gynecology

## 2022-05-12 DIAGNOSIS — Z3A39 39 weeks gestation of pregnancy: Secondary | ICD-10-CM | POA: Insufficient documentation

## 2022-05-12 DIAGNOSIS — O26893 Other specified pregnancy related conditions, third trimester: Secondary | ICD-10-CM | POA: Insufficient documentation

## 2022-05-12 LAB — VH AMNISURE: Rupture of Membrane AmniSure: NEGATIVE

## 2022-05-12 NOTE — Discharge Instr - AVS First Page (Signed)
Call your OB Provider, or return to the hospital, if you have any of the following signs:     If more than 36 weeks, regular painful contractions every 3-5 minutes for one hour that increase in strength.     If less than 36 weeks, 6 or more contractions in an hour.     Persistent nausea, vomiting or diarrhea; accompanied by the inability to keep liquids down.     Leaking of amniotic fluid, or water breaking; this can be a gush or small trickle.     Vaginal bleeding, especially any bright red bleeding like a period or passing clots (it is normal to have spotting after vaginal exam or intercourse).     Concern about a decrease in your baby's movements.     Temperature greater than 100.4(F) orally.     Severe headache which is not relieved 60 minutes after taking Tylenol (Acetaminophen); Blurry vision or spots before your eyes, not just when you suddenly stand up; Severe heartburn or pain on the upper rights side of your abdomen that is not relieved by an antacid; Sudden increase in swelling in your face, hands, or feet.

## 2022-05-12 NOTE — OB ED Provider Note (Signed)
OBSTETRICAL EMERGENCY DEPARTMENT   NOTE    Date Time: 05/12/22 2:07 AM  Patient Name: Kim Howe    Principal Problem:   ctx    Subjective:   Patient is a 21 y.o. G1P0 @ [redacted]w[redacted]d with ctx, no lof/bleeding. +FM. PNC with WOB complicated by anxiety/depression, benign intracranial hypertension, anemia, GBS positive.     OB History       Gravida   1    Para        Term        Preterm        AB        Living             SAB        IAB        Ectopic        Multiple        Live Births                    Office records were reviewed.     Review of Systems:   10 systems reviewed.  All  are negative except as noted in HPI.    Medications:     Medications Prior to Admission   Medication Sig Dispense Refill Last Dose    Prenatal MV-Min-Fe Fum-FA-DHA (PRENATAL 1 PO) Take by mouth   05/11/2022    doxylamine-pyridoxine (DICLEGIS) 10-10 MG Tablet Delayed Response Take 2 tablets by mouth nightly Two tablets at bedtime on day 1 and 2; if symptoms persist, take 1 tablet in morning and 2 tablets at bedtime on day 3; if symptoms persist, may increase to 1 tablet in morning, 1 tablet mid-afternoon, and 2 tablets at bedtime on day 4 (maximum: doxylamine 40 mg/pyridoxine 40 mg (4 tablets) per day). 30 tablet 0      Allergies   Allergen Reactions    Other      Mint       Past Medical History:   Diagnosis Date    Anxiety     Depression     Intracranial hypertension     PCOS (polycystic ovarian syndrome)      Past Surgical History:   Procedure Laterality Date    BRAIN SURGERY      brain shunt 3/20       Social History     Socioeconomic History    Marital status: Single   Tobacco Use    Smoking status: Every Day     Packs/day: 0.30     Years: 4.00     Total pack years: 1.20     Types: Cigarettes    Smokeless tobacco: Never   Vaping Use    Vaping Use: Never used   Substance and Sexual Activity    Alcohol use: Not Currently     Comment: occ    Drug use: Not Currently     Types: Marijuana    Sexual activity: Yes     Partners: Female      History reviewed. No pertinent family history.    Physical Exam:     Vitals:    05/12/22 0129   BP: 112/80   Pulse: 77   Resp: 16   Temp: 97.9 F (36.6 C)    Body mass index is 32.84 kg/m.    General: Alert, No distress, oriented to person, place and time.    Cardiovascular: Normal rate,regular rhythm,no murmurs,  Respiratory:Clear to auscultation bilaterally,good air movement   Abdomen:soft, nontender,nondistended,no masses or organomegaly  Musculoskeletal:no edema, good ROM,no calf tenderness  Neurologic/ Psychiatric grossly intact,normal mood,behavior,speech,and thought processes.  Integumentary: normal coloration and turgor, no rashes, no suspicious skin lesions noted  Genitourinary:      Pelvic:    Last Cervical Exam:  Dilation: 3 (05/12/22 0129)  Effacement (%): 70 (05/12/22 0129)  Station: -3 (05/12/22 0129)  Method: Manual (05/12/22 0129)  OB Examiner: Payton DoughtyMoore, T RN (05/12/22 0129)    NST:   NST reviewed:  see note    Labs:     Results       ** No results found for the last 24 hours. **            Lab Results   Component Value Date    WBC 7.5 09/25/2021    HGB 15.7 09/25/2021    PLT 349 09/25/2021    NA 137 09/27/2021    K 3.6 09/27/2021    BUN 6 (L) 09/27/2021    CREAT 0.65 09/27/2021    MG 1.7 09/27/2021    AST 14 09/26/2021    ALB 4.0 09/26/2021    INR 1.0 09/25/2021         Assessment:   21 y.o. G1P0 @ 6380w6d with ctx  Plan:   Observe for labor          Signed by: Tish Fredericksonenise F Jovannie Ulibarri, DO  2:07 AM

## 2022-05-12 NOTE — Procedures (Signed)
NST Note    INDICATION- ctx    NST reviewed:  NST:  reactive  Fetal Heart Baseline Rate: 125 BPM moderate variability, Accelerations seen, No decelerations seen, and Category 1   On interpretation today.    Toco:q3-5 minutes    Time on the monitor:20 minutes    Tish Frederickson, DO   05/12/22 2:09 AM

## 2022-05-13 ENCOUNTER — Inpatient Hospital Stay: Payer: BC Managed Care – PPO | Admitting: Anesthesiology

## 2022-05-13 ENCOUNTER — Inpatient Hospital Stay
Admission: EM | Admit: 2022-05-13 | Discharge: 2022-05-16 | DRG: 787 | Disposition: A | Discharge status: Home / Self Care | Payer: BC Managed Care – PPO | Attending: Obstetrics & Gynecology | Admitting: Obstetrics & Gynecology

## 2022-05-13 ENCOUNTER — Encounter
Admission: EM | Disposition: A | Discharge status: Home / Self Care | Payer: Self-pay | Source: Home / Self Care | Attending: Obstetrics & Gynecology

## 2022-05-13 ENCOUNTER — Encounter: Payer: Self-pay | Admitting: Obstetrics & Gynecology

## 2022-05-13 DIAGNOSIS — O99824 Streptococcus B carrier state complicating childbirth: Secondary | ICD-10-CM | POA: Diagnosis present

## 2022-05-13 DIAGNOSIS — F17210 Nicotine dependence, cigarettes, uncomplicated: Secondary | ICD-10-CM | POA: Diagnosis present

## 2022-05-13 DIAGNOSIS — O99892 Other specified diseases and conditions complicating childbirth: Secondary | ICD-10-CM | POA: Diagnosis not present

## 2022-05-13 DIAGNOSIS — Z3A40 40 weeks gestation of pregnancy: Secondary | ICD-10-CM

## 2022-05-13 DIAGNOSIS — G932 Benign intracranial hypertension: Secondary | ICD-10-CM | POA: Diagnosis present

## 2022-05-13 DIAGNOSIS — Z982 Presence of cerebrospinal fluid drainage device: Secondary | ICD-10-CM

## 2022-05-13 DIAGNOSIS — O368390 Maternal care for abnormalities of the fetal heart rate or rhythm, unspecified trimester, not applicable or unspecified: Secondary | ICD-10-CM | POA: Diagnosis not present

## 2022-05-13 DIAGNOSIS — O99354 Diseases of the nervous system complicating childbirth: Secondary | ICD-10-CM | POA: Diagnosis present

## 2022-05-13 DIAGNOSIS — O9902 Anemia complicating childbirth: Secondary | ICD-10-CM | POA: Diagnosis present

## 2022-05-13 DIAGNOSIS — O99334 Smoking (tobacco) complicating childbirth: Secondary | ICD-10-CM | POA: Diagnosis present

## 2022-05-13 DIAGNOSIS — Z3490 Encounter for supervision of normal pregnancy, unspecified, unspecified trimester: Secondary | ICD-10-CM

## 2022-05-13 LAB — CBC AND DIFFERENTIAL
Basophils %: 0.1 % (ref 0.0–3.0)
Basophils Absolute: 0 10*3/uL (ref 0.0–0.3)
Eosinophils %: 3 % (ref 0.0–7.0)
Eosinophils Absolute: 0.2 10*3/uL (ref 0.0–0.8)
Hematocrit: 31 % — ABNORMAL LOW (ref 36.0–48.0)
Hemoglobin: 10.3 gm/dL — ABNORMAL LOW (ref 12.0–16.0)
Lymphocytes Absolute: 2.7 10*3/uL (ref 0.6–5.1)
Lymphocytes: 32.9 % (ref 15.0–46.0)
MCH: 26 pg — ABNORMAL LOW (ref 28–35)
MCHC: 33 gm/dL (ref 32–36)
MCV: 80 fL (ref 80–100)
MPV: 9.4 fL (ref 6.0–10.0)
Monocytes Absolute: 0.4 10*3/uL (ref 0.1–1.7)
Monocytes: 5.2 % (ref 3.0–15.0)
Neutrophils %: 58.8 % (ref 42.0–78.0)
Neutrophils Absolute: 4.8 10*3/uL (ref 1.7–8.6)
PLT CT: 191 10*3/uL (ref 130–440)
RBC: 3.9 10*6/uL (ref 3.80–5.00)
RDW: 14.8 % — ABNORMAL HIGH (ref 11.0–14.0)
WBC: 8.2 10*3/uL (ref 4.0–11.0)

## 2022-05-13 LAB — VH AMNISURE: Rupture of Membrane AmniSure: NEGATIVE

## 2022-05-13 LAB — VH URINE DRUG SCREEN - NO CONFIRMATION
Propoxyphene: NEGATIVE
Urine Amphetamine: NEGATIVE
Urine Barbiturates: NEGATIVE
Urine Benzodiazepines: NEGATIVE
Urine Buprenorphine: NEGATIVE
Urine Cannabinoids: NEGATIVE
Urine Cocaine: NEGATIVE
Urine Creatinine Random: 135 mg/dL
Urine Ecstasy Screen: NEGATIVE
Urine Fentanyl Screen: NEGATIVE
Urine Methadone Screen: NEGATIVE
Urine Opiates: NEGATIVE
Urine Oxycodone: NEGATIVE
Urine Phencyclidine: NEGATIVE
Urine Specific Gravity: 1.019 (ref 1.001–1.040)
pH, Urine: 5.9 pH (ref 5.0–8.0)

## 2022-05-13 LAB — TYPE AND SCREEN
AB Screen: NEGATIVE
ABO Rh: A POS

## 2022-05-13 SURGERY — Surgical Case
Anesthesia: Anesthesia General | Site: Abdomen | Wound class: Clean Contaminated

## 2022-05-13 MED ORDER — MORPHINE SULFATE (PF) 0.5 MG/ML IJ SOLN
INTRAMUSCULAR | Status: DC | PRN
Start: 2022-05-13 — End: 2022-05-13
  Administered 2022-05-13: 3 mg via EPIDURAL
  Administered 2022-05-13: 2 mg via EPIDURAL

## 2022-05-13 MED ORDER — LACTATED RINGERS IV SOLN
INTRAVENOUS | Status: DC | PRN
Start: 2022-05-13 — End: 2022-05-13

## 2022-05-13 MED ORDER — KETOROLAC TROMETHAMINE 30 MG/ML IJ SOLN
INTRAMUSCULAR | Status: DC | PRN
Start: 2022-05-13 — End: 2022-05-13
  Administered 2022-05-13: 15 mg via INTRAVENOUS

## 2022-05-13 MED ORDER — BUTORPHANOL TARTRATE 1 MG/ML IJ SOLN
0.5000 mg | INTRAMUSCULAR | Status: DC | PRN
Start: 2022-05-13 — End: 2022-05-16

## 2022-05-13 MED ORDER — DIPHENHYDRAMINE HCL 50 MG/ML IJ SOLN
25.0000 mg | INTRAMUSCULAR | Status: DC | PRN
Start: 2022-05-13 — End: 2022-05-16

## 2022-05-13 MED ORDER — ONDANSETRON HCL 4 MG/2ML IJ SOLN
8.0000 mg | Freq: Three times a day (TID) | INTRAMUSCULAR | Status: DC | PRN
Start: 2022-05-13 — End: 2022-05-13

## 2022-05-13 MED ORDER — LACTATED RINGERS IV SOLN
INTRAVENOUS | Status: DC
Start: 2022-05-13 — End: 2022-05-13

## 2022-05-13 MED ORDER — DOCUSATE SODIUM 100 MG PO CAPS
200.0000 mg | ORAL_CAPSULE | Freq: Two times a day (BID) | ORAL | Status: DC | PRN
Start: 2022-05-13 — End: 2022-05-16
  Administered 2022-05-14 – 2022-05-16 (×3): 200 mg via ORAL
  Filled 2022-05-13 (×3): qty 2

## 2022-05-13 MED ORDER — SODIUM CHLORIDE (PF) 0.9 % IJ SOLN
3.0000 mL | Freq: Two times a day (BID) | INTRAMUSCULAR | Status: DC
Start: 2022-05-13 — End: 2022-05-16

## 2022-05-13 MED ORDER — PROPOFOL 200 MG/20ML IV EMUL
INTRAVENOUS | Status: DC | PRN
Start: 2022-05-13 — End: 2022-05-13
  Administered 2022-05-13: 200 mg via INTRAVENOUS

## 2022-05-13 MED ORDER — SODIUM CHLORIDE 0.9 % IV MBP
5.0000 10*6.[IU] | Freq: Once | INTRAVENOUS | Status: AC
Start: 2022-05-13 — End: 2022-05-13
  Administered 2022-05-13: 5 10*6.[IU] via INTRAVENOUS
  Filled 2022-05-13: qty 5

## 2022-05-13 MED ORDER — ONDANSETRON HCL 4 MG/2ML IJ SOLN
4.0000 mg | Freq: Once | INTRAMUSCULAR | Status: DC | PRN
Start: 2022-05-13 — End: 2022-05-13

## 2022-05-13 MED ORDER — MORPHINE SULFATE (PF) 0.5 MG/ML IJ SOLN
INTRAMUSCULAR | Status: AC
Start: 2022-05-13 — End: ?
  Filled 2022-05-13: qty 10

## 2022-05-13 MED ORDER — ONDANSETRON HCL 4 MG/2ML IJ SOLN
INTRAMUSCULAR | Status: DC | PRN
Start: 2022-05-13 — End: 2022-05-13
  Administered 2022-05-13: 8 mg via INTRAVENOUS

## 2022-05-13 MED ORDER — ACETAMINOPHEN 650 MG RE SUPP
325.0000 mg | RECTAL | Status: DC | PRN
Start: 2022-05-13 — End: 2022-05-16

## 2022-05-13 MED ORDER — DEXAMETHASONE SODIUM PHOSPHATE 4 MG/ML IJ SOLN (WRAP)
INTRAMUSCULAR | Status: DC | PRN
Start: 2022-05-13 — End: 2022-05-13
  Administered 2022-05-13: 8 mg via INTRAVENOUS

## 2022-05-13 MED ORDER — PROMETHAZINE HCL 12.5 MG RE SUPP
12.5000 mg | Freq: Four times a day (QID) | RECTAL | Status: DC | PRN
Start: 2022-05-13 — End: 2022-05-16

## 2022-05-13 MED ORDER — LACTATED RINGERS IV SOLN
INTRAVENOUS | Status: DC
Start: 2022-05-13 — End: 2022-05-16

## 2022-05-13 MED ORDER — FENTANYL CITRATE (PF) 50 MCG/ML IJ SOLN (WRAP)
INTRAMUSCULAR | Status: AC
Start: 2022-05-13 — End: ?
  Filled 2022-05-13: qty 2

## 2022-05-13 MED ORDER — VH OXYTOCIN INFUSION 20 UNITS/1000 ML NS (POST PARTUM)
INTRAVENOUS | Status: DC | PRN
Start: 2022-05-13 — End: 2022-05-13
  Administered 2022-05-13: 40 [IU]/h via INTRAVENOUS

## 2022-05-13 MED ORDER — CEFAZOLIN SODIUM 1 G IJ SOLR
INTRAMUSCULAR | Status: DC | PRN
Start: 2022-05-13 — End: 2022-05-13
  Administered 2022-05-13: 2 g via INTRAVENOUS

## 2022-05-13 MED ORDER — VH BUPIVACAINE 0.125% FENTANYL 2 MCG/ML BAG (PCEA)
EPIDURAL | Status: AC
Start: 2022-05-13 — End: 2022-05-13
  Administered 2022-05-13: 100 mL via EPIDURAL
  Filled 2022-05-13: qty 100

## 2022-05-13 MED ORDER — ACETAMINOPHEN 325 MG PO TABS
650.0000 mg | ORAL_TABLET | Freq: Four times a day (QID) | ORAL | Status: DC | PRN
Start: 2022-05-13 — End: 2022-05-13

## 2022-05-13 MED ORDER — ACETAMINOPHEN 325 MG PO TABS
325.0000 mg | ORAL_TABLET | ORAL | Status: DC | PRN
Start: 2022-05-13 — End: 2022-05-16

## 2022-05-13 MED ORDER — FENTANYL CITRATE (PF) 50 MCG/ML IJ SOLN (WRAP)
INTRAMUSCULAR | Status: DC | PRN
Start: 2022-05-13 — End: 2022-05-13
  Administered 2022-05-13 (×2): 100 ug via INTRAVENOUS

## 2022-05-13 MED ORDER — IBUPROFEN 600 MG PO TABS
600.0000 mg | ORAL_TABLET | Freq: Four times a day (QID) | ORAL | Status: DC | PRN
Start: 2022-05-13 — End: 2022-05-16
  Administered 2022-05-14 – 2022-05-16 (×7): 600 mg via ORAL
  Filled 2022-05-13 (×7): qty 1

## 2022-05-13 MED ORDER — ONDANSETRON HCL 4 MG/2ML IJ SOLN
4.0000 mg | Freq: Three times a day (TID) | INTRAMUSCULAR | Status: DC | PRN
Start: 2022-05-13 — End: 2022-05-16

## 2022-05-13 MED ORDER — VH BUPIVACAINE 0.125% FENTANYL 2 MCG/ML BAG (PCEA)
EPIDURAL | Status: DC
Start: 2022-05-13 — End: 2022-05-13
  Administered 2022-05-13: 100 mL via EPIDURAL
  Filled 2022-05-13: qty 100

## 2022-05-13 MED ORDER — VH OXYTOCIN INFUSION 20 UNITS/1000 ML NS (POST PARTUM)
7.5000 [IU]/h | INTRAVENOUS | Status: AC
Start: 2022-05-13 — End: 2022-05-13

## 2022-05-13 MED ORDER — HYDROMORPHONE HCL 0.5 MG/0.5 ML IJ SOLN
0.5000 mg | INTRAMUSCULAR | Status: DC | PRN
Start: 2022-05-13 — End: 2022-05-13

## 2022-05-13 MED ORDER — EPHEDRINE SULFATE 50 MG/ML IJ/IV SOLN (WRAP)
10.0000 mg | Status: DC | PRN
Start: 2022-05-13 — End: 2022-05-13

## 2022-05-13 MED ORDER — LACTATED RINGERS IV SOLN
125.0000 mL/h | INTRAVENOUS | Status: AC
Start: 2022-05-13 — End: 2022-05-14

## 2022-05-13 MED ORDER — FENTANYL CITRATE (PF) 50 MCG/ML IJ SOLN (WRAP)
25.0000 ug | INTRAMUSCULAR | Status: DC | PRN
Start: 2022-05-13 — End: 2022-05-13

## 2022-05-13 MED ORDER — MEASLES, MUMPS & RUBELLA VAC IJ SOLR
0.5000 mL | INTRAMUSCULAR | Status: DC | PRN
Start: 2022-05-13 — End: 2022-05-16

## 2022-05-13 MED ORDER — STERILE WATER FOR INJECTION IJ SOLN
2.0000 g | Freq: Three times a day (TID) | INTRAMUSCULAR | Status: AC
Start: 2022-05-13 — End: 2022-05-14
  Administered 2022-05-13 – 2022-05-14 (×2): 2 g via INTRAVENOUS
  Filled 2022-05-13 (×2): qty 2000

## 2022-05-13 MED ORDER — METHYLERGONOVINE MALEATE 0.2 MG/ML IJ SOLN
0.2000 mg | Freq: Once | INTRAMUSCULAR | Status: DC | PRN
Start: 2022-05-13 — End: 2022-05-16

## 2022-05-13 MED ORDER — SODIUM CHLORIDE (PF) 0.9 % IJ SOLN
0.1000 mg | INTRAMUSCULAR | Status: DC | PRN
Start: 2022-05-13 — End: 2022-05-13

## 2022-05-13 MED ORDER — FENTANYL CITRATE (PF) 50 MCG/ML IJ SOLN (WRAP)
100.0000 ug | INTRAMUSCULAR | Status: DC | PRN
Start: 2022-05-13 — End: 2022-05-13
  Administered 2022-05-13: 100 ug via INTRAVENOUS
  Filled 2022-05-13 (×2): qty 2

## 2022-05-13 MED ORDER — TETANUS-DIPHTH-ACELL PERTUSSIS 5-2.5-18.5 LF-MCG/0.5 IM SUSY
0.5000 mL | PREFILLED_SYRINGE | INTRAMUSCULAR | Status: DC | PRN
Start: 2022-05-13 — End: 2022-05-16

## 2022-05-13 MED ORDER — METOCLOPRAMIDE HCL 5 MG/ML IJ SOLN
10.0000 mg | Freq: Once | INTRAMUSCULAR | Status: DC | PRN
Start: 2022-05-13 — End: 2022-05-13

## 2022-05-13 MED ORDER — ACETAMINOPHEN 10 MG/ML IV SOLN
1000.0000 mg | Freq: Once | INTRAVENOUS | Status: AC
Start: 2022-05-13 — End: 2022-05-13
  Administered 2022-05-13: 1000 mg via INTRAVENOUS
  Filled 2022-05-13: qty 100

## 2022-05-13 MED ORDER — TERBUTALINE SULFATE 1 MG/ML IJ SOLN
INTRAMUSCULAR | Status: AC
Start: 2022-05-13 — End: 2022-05-13
  Administered 2022-05-13: 0.25 mg
  Filled 2022-05-13: qty 1

## 2022-05-13 MED ORDER — PENICILLIN G POT IN DEXTROSE 60000 UNIT/ML IV SOLN
3.0000 10*6.[IU] | INTRAVENOUS | Status: DC
Start: 2022-05-13 — End: 2022-05-13
  Administered 2022-05-13: 3 10*6.[IU] via INTRAVENOUS
  Filled 2022-05-13: qty 50

## 2022-05-13 MED ORDER — PROMETHAZINE HCL 25 MG PO TABS
25.0000 mg | ORAL_TABLET | Freq: Four times a day (QID) | ORAL | Status: DC | PRN
Start: 2022-05-13 — End: 2022-05-16

## 2022-05-13 MED ORDER — CALCIUM CARBONATE ANTACID 500 MG PO CHEW
1000.0000 mg | CHEWABLE_TABLET | Freq: Three times a day (TID) | ORAL | Status: DC | PRN
Start: 2022-05-13 — End: 2022-05-16

## 2022-05-13 MED ORDER — KETOROLAC TROMETHAMINE 15 MG/ML IJ SOLN
15.0000 mg | Freq: Four times a day (QID) | INTRAMUSCULAR | Status: DC | PRN
Start: 2022-05-13 — End: 2022-05-16
  Administered 2022-05-13 – 2022-05-14 (×3): 15 mg via INTRAVENOUS
  Filled 2022-05-13 (×3): qty 1

## 2022-05-13 MED ORDER — FENTANYL CITRATE (PF) 50 MCG/ML IJ SOLN (WRAP)
25.0000 ug | INTRAMUSCULAR | Status: DC | PRN
Start: 2022-05-13 — End: 2022-05-13
  Administered 2022-05-13 (×2): 25 ug via INTRAVENOUS

## 2022-05-13 MED ORDER — VH BUPIVACAINE 0.125% FENTANYL 2 MCG/ML BOLUS
10.0000 mL | EPIDURAL | Status: DC | PRN
Start: 2022-05-13 — End: 2022-05-13
  Administered 2022-05-13 (×2): 10 mL via EPIDURAL

## 2022-05-13 MED ORDER — KETOROLAC TROMETHAMINE 30 MG/ML IJ SOLN
INTRAMUSCULAR | Status: AC
Start: 2022-05-13 — End: ?
  Filled 2022-05-13: qty 1

## 2022-05-13 MED ORDER — SODIUM CHLORIDE (PF) 0.9 % IJ SOLN
0.1000 mg | INTRAMUSCULAR | Status: DC | PRN
Start: 2022-05-13 — End: 2022-05-16

## 2022-05-13 MED ORDER — PROCHLORPERAZINE EDISYLATE 10 MG/2ML IJ SOLN
5.0000 mg | INTRAMUSCULAR | Status: DC | PRN
Start: 2022-05-13 — End: 2022-05-16

## 2022-05-13 MED ORDER — ONDANSETRON 4 MG PO TBDP
4.0000 mg | ORAL_TABLET | Freq: Three times a day (TID) | ORAL | Status: DC | PRN
Start: 2022-05-13 — End: 2022-05-16

## 2022-05-13 MED ORDER — SIMETHICONE 80 MG PO CHEW
80.0000 mg | CHEWABLE_TABLET | Freq: Four times a day (QID) | ORAL | Status: DC | PRN
Start: 2022-05-13 — End: 2022-05-16

## 2022-05-13 MED ORDER — NALBUPHINE HCL 10 MG/ML IJ SOLN
2.5000 mg | INTRAMUSCULAR | Status: DC | PRN
Start: 2022-05-13 — End: 2022-05-13

## 2022-05-13 MED ORDER — BUPIVACAINE HCL (PF) 0.5 % IJ SOLN
INTRAMUSCULAR | Status: AC
Start: 2022-05-13 — End: 2022-05-13
  Filled 2022-05-13: qty 10

## 2022-05-13 MED ORDER — HYDRALAZINE HCL 20 MG/ML IJ SOLN
10.0000 mg | INTRAMUSCULAR | Status: DC | PRN
Start: 2022-05-13 — End: 2022-05-13

## 2022-05-13 MED ORDER — LIDOCAINE-EPINEPHRINE 2 %-1:200000 IJ SOLN
INTRAMUSCULAR | Status: AC
Start: 2022-05-13 — End: 2022-05-13
  Filled 2022-05-13: qty 20

## 2022-05-13 MED ORDER — BUPIVACAINE HCL (PF) 0.25 % IJ SOLN
INTRAMUSCULAR | Status: DC | PRN
Start: 2022-05-13 — End: 2022-05-13
  Administered 2022-05-13: 10 mL via EPIDURAL

## 2022-05-13 MED ORDER — OXYCODONE-ACETAMINOPHEN 5-325 MG PO TABS
1.0000 | ORAL_TABLET | ORAL | Status: DC | PRN
Start: 2022-05-13 — End: 2022-05-16
  Administered 2022-05-14 – 2022-05-16 (×9): 1 via ORAL
  Filled 2022-05-13 (×9): qty 1

## 2022-05-13 MED ORDER — SUCCINYLCHOLINE CHLORIDE 20 MG/ML IJ SOLN
INTRAMUSCULAR | Status: DC | PRN
Start: 2022-05-13 — End: 2022-05-13
  Administered 2022-05-13: 100 mg via INTRAVENOUS

## 2022-05-13 MED ORDER — OXYCODONE HCL 5 MG PO TABS
5.0000 mg | ORAL_TABLET | Freq: Once | ORAL | Status: DC | PRN
Start: 2022-05-13 — End: 2022-05-13

## 2022-05-13 MED ORDER — VH DEXTROSE 5 % IN LACTATED RINGERS IV BOLUS
500.0000 mL | Freq: Once | INTRAVENOUS | Status: DC | PRN
Start: 2022-05-13 — End: 2022-05-13

## 2022-05-13 MED ORDER — ACETAMINOPHEN 10 MG/ML IV SOLN
INTRAVENOUS | Status: DC | PRN
Start: 2022-05-13 — End: 2022-05-13
  Administered 2022-05-13: 1000 mg via INTRAVENOUS

## 2022-05-13 MED ORDER — ONDANSETRON 4 MG PO TBDP
4.0000 mg | ORAL_TABLET | Freq: Three times a day (TID) | ORAL | Status: DC | PRN
Start: 2022-05-13 — End: 2022-05-13

## 2022-05-13 MED ORDER — PRENATAL 27-0.8 MG PO TABS
1.0000 | ORAL_TABLET | Freq: Every day | ORAL | Status: DC
Start: 2022-05-13 — End: 2022-05-16
  Administered 2022-05-14 – 2022-05-16 (×3): 1 via ORAL
  Filled 2022-05-13 (×3): qty 1

## 2022-05-13 MED ORDER — SODIUM CHLORIDE (PF) 0.9 % IJ SOLN
3.0000 mL | Freq: Two times a day (BID) | INTRAMUSCULAR | Status: DC
Start: 2022-05-15 — End: 2022-05-16

## 2022-05-13 MED ORDER — HYDRALAZINE HCL 20 MG/ML IJ SOLN
10.0000 mg | INTRAMUSCULAR | Status: DC | PRN
Start: 2022-05-13 — End: 2022-05-16

## 2022-05-13 MED ORDER — VH OXYTOCIN INFUSION 20 UNITS/1000 ML NS (LABOR)
INTRAVENOUS | Status: AC
Start: 2022-05-13 — End: 2022-05-13
  Filled 2022-05-13: qty 1000

## 2022-05-13 SURGICAL SUPPLY — 16 items
ADHESIVE DERMABOND HIVIC PEN (Dressings) ×2 IMPLANT
BLADE CLIPPER (Supply) ×2 IMPLANT
CHLORAPREP W/ORANGE TINT 26ML (Supply) ×2 IMPLANT
DRAPE HALF (Supply) ×2 IMPLANT
GLOVE BIOGEL UND PI IND SZ 6.5 (Supply) ×2 IMPLANT
GLOVE BIOGEL UT PI SURG SZ 6.5 (Supply) ×2 IMPLANT
PACK C-SECTION (Supply) ×2 IMPLANT
PAD SURGICEL NU-KNIT 3 X 4 (Medication) ×4 IMPLANT
SLEEVE SCD KNEE #5329 (Supply) ×2 IMPLANT
SOL SALINE IRRIG 500ML (Supply) ×2 IMPLANT
SUT PDS II 4-0 Z662H (Supply) ×2 IMPLANT
SUT PLAIN GUT 2-0 853H (Supply) ×2 IMPLANT
SUT VICRYL 0 J358H (Supply) ×8 IMPLANT
SUT VICRYL 2-0 J357H (Supply) ×2 IMPLANT
TRAY URINEMETER FOLEY LATEXFRE (TDC (Tubes, Draines, Catheters)) ×1
TRAY URINEMETER FOLEY LATEXFRE (TDC (Tubes, Drains, Catheters)) ×1 IMPLANT

## 2022-05-13 NOTE — Anesthesia Preprocedure Evaluation (Signed)
Anesthesia Evaluation    AIRWAY    Mallampati: unable to assess    TM distance: >3 FB  Neck ROM: full  Mouth Opening:full   CARDIOVASCULAR           DENTAL                   PULMONARY         OTHER FINDINGS                                      Relevant Problems   No relevant active problems       PSS Anesthesia Comments: EMERGENCY    PRIORITY CS CALLED FOR FETAL BRADYCARDIA WITH LABOR    Vp shunt        Anesthesia Plan    ASA 3 - emergent     general                           Post Op: plan for postoperative opioid use    Post op pain management: epidural    informed consent obtained      pertinent labs reviewed             Signed by: Janett Labella, MD 05/13/22

## 2022-05-13 NOTE — Anesthesia Postprocedure Evaluation (Signed)
Post Operative Evaluation    Patient: Kim Howe    Procedures performed: Procedure(s):  CESAREAN SECTION    Patient location: L+D recovery    Last vitals:   Vitals:    05/13/22 1502   BP: 108/70   Pulse: (!) 112   Resp: 15   Temp: 37.1 C (98.8 F)   SpO2:        BP: 108/70   Temp: 37.1 C (98.8 F)   Heart Rate: (!) 112   Resp Rate: 15        Post pain: Pain control adequate, continue current therapy     Mental Status:  Awake, alert    Respiratory Function: room air    Cardiovascular: stable    Nausea/Vomiting: therapy adequate; appropriate medications received    Hydration Status: adequate    Post assessment: no apparent anesthetic complications, no reportable events and no evidence of recall

## 2022-05-13 NOTE — Op Note (Signed)
Cesarean Section Procedure Note    Pre-operative Diagnosis: [redacted]w[redacted]d, with FHR 50( FHR deceleration)  Post-operative Diagnosis: same    Surgeon: Everett Graff, MD MD    Assistant: Myrle Sheng    Findings:  A viable female  infant with APGARs of 8  and 9  at 1 and 5 minutes respectively.  Weight 6 lb 12.6 oz (3080 g) .  Normal fallopian tubes and ovaries were seen bilaterally.  VP shunt seen completely - end to end in the peritoneal cavity  Estimated Blood Loss:             Specimens:   None           Complications:  None; patient tolerated the procedure well.        Anesthesia: General Endotracheal with Epidural Regional    ASA Class: 2    Procedure Details   The patient was seen in the Holding Room. The risks, benefits, complications, treatment options, and expected outcomes were discussed with the patient.  The patient concurred with the proposed plan, giving informed consent.  The site of surgery properly noted. The patient was taken to Operating Room, identified as Kim Howe and the procedure verified as C-Section Delivery without surgical sterilization. A Time Out was held and the above information confirmed.    After induction of anesthesia, the patient was draped and prepped in the usual sterile manner. A Pfannenstiel incision was made and carried down through the subcutaneous tissue to the fascia. Fascial incision was made and extended transversely with Mayo scissors. The fascia was separated from the underlying rectus tissue superiorly and inferiorly using blunt and sharp dissection. The peritoneum was identified and entered digitally. Peritoneal incision was extended digitally in a cephalo-caudad fashion. An Alexis Cyndia Diver was placed into the incision. The internal ring was swept to ensure that there was no entrapment on internal organs. The utero-vesical peritoneal reflection was incised transversely and the bladder flap was bluntly freed from the lower uterine segment.    A low  transverse uterine incision was made and extended cephalo-caudad.  Infant was delivered atraumatically. After the umbilical cord was clamped and cut cord blood was obtained for evaluation. The placenta was delivered by uterine massage. Multiple loops of VP shunt seen and unfortunately both the tips were found to be in the peritoneal cavity. It was removed and put in a specimen bag. Placenta was intact and appeared normal.  The uterus was cleared of all clot and debris using a laparotomy sponge.  Uterus, tubes and ovaries appeared normal. The uterine incision was closed in a running locked fashion with 0 vicryl. A second layer of the same suture was placed in an imbricating manner.  Hemostasis was observed.  The peritoneum was closed using 2-0 vicryl. The fascia was then reapproximated with a running suture of 0 Vicryl.  The subcutaneous tissue did require closure with 3-0 vicryl. The skin was reapproximated with  4-0 PDS>    Instrument, sponge, and needle counts were correct after uterine closure, prior the abdominal closure, and at the conclusion of the case.    Everett Graff, MD MD 05/13/2022

## 2022-05-13 NOTE — H&P (Signed)
OB LABOR AND DELIVERY ADMISSION H&P    Date/Time: 06/27/236:03 AM       Name: Kim, Howe  MRN: [id number]    Chief Complaint   Patient presents with    Contractions     Pt reports contractions since around 0300; is questioning whether or not her water has broken; pt reports good fetal movement; denies vaginal bleeding    Rupture of Membranes Evaluation       HPI:   Kim Howe is a 21 y.o. G55P0000 female with a gestation age of [redacted]w[redacted]d and Estimated Date of Delivery: 05/13/22 who presents to the hospital for:  contractions that started around 0300    Her prenatal care is significant for  benign ICH with VP shunt, anemia, GBS+, anxiety/depression      Review of Systems:     Constitutional: Negative for fever and malaise/fatigue.   HENT: Negative for nosebleeds.    Eyes: Negative for blurred vision and double vision.   Respiratory: Negative for cough, shortness of breath and wheezing.    Cardiovascular: Negative for chest pain and leg swelling.   Gastrointestinal: Negative for heartburn, nausea, diarrhea and constipation.   Genitourinary: Negative for dysuria, urgency and flank pain.   Musculoskeletal: Negative for back pain and falls.   Skin: Negative for rash.   Neurological: Negative for dizziness, seizures and headaches.   Psychiatric/Behavioral: Negative for depression and substance abuse. The patient is not nervous/anxious.        OB History       Gravida   1    Para   0    Term   0    Preterm   0    AB   0    Living   0         SAB   0    IAB   0    Ectopic   0    Multiple   0    Live Births   0                 Past Medical History:   Diagnosis Date    Anxiety     Depression     Intracranial hypertension     PCOS (polycystic ovarian syndrome)        Past Surgical History:   Procedure Laterality Date    BRAIN SURGERY      brain shunt 3/20       Allergies   Allergen Reactions    Other      Mint      Other Animal      Cat dander       Medications Prior to Admission   Medication Sig  Dispense Refill Last Dose    Prenatal MV-Min-Fe Fum-FA-DHA (PRENATAL 1 PO) Take 1 tablet by mouth daily   05/12/2022    doxylamine-pyridoxine (DICLEGIS) 10-10 MG Tablet Delayed Response Take 2 tablets by mouth nightly Two tablets at bedtime on day 1 and 2; if symptoms persist, take 1 tablet in morning and 2 tablets at bedtime on day 3; if symptoms persist, may increase to 1 tablet in morning, 1 tablet mid-afternoon, and 2 tablets at bedtime on day 4 (maximum: doxylamine 40 mg/pyridoxine 40 mg (4 tablets) per day). 30 tablet 0 More than a month       Social History     Socioeconomic History    Marital status: Single   Tobacco Use    Smoking status: Every Day  Packs/day: 0.30     Years: 4.00     Total pack years: 1.20     Types: Cigarettes    Smokeless tobacco: Never   Vaping Use    Vaping Use: Never used   Substance and Sexual Activity    Alcohol use: Not Currently     Comment: occ    Drug use: Not Currently     Types: Marijuana    Sexual activity: Yes     Partners: Female       History reviewed. No pertinent family history.      Vital Signs:        Body mass index is 32.77 kg/m.      Physical Exam:     Physical Exam     Constitutional: She is oriented to person, place, and time. She appears well-developed.   Head: Normocephalic and atraumatic.   Eyes: Conjunctivae normal and EOM are normal. No scleral icterus.   Neck: Normal range of motion.   Pulmonary/Chest: Effort normal. No respiratory distress.   Abdominal: Soft. Nontender to palpation. There is no rebound and no guarding. No right upper quadrant or epigastric pain.  Uterus: Gravid, No fundal tenderness   Musculoskeletal: Normal range of motion. She exhibits no edema.   Neurological: She is alert and oriented to person, place, and time. Normal reflexes, No clonus  Skin: Skin is warm. No rash noted. No erythema.     Genitourinary: There is no rash, tenderness or lesion.  No vaginal discharge found.     Cervix:Dilation: 4 (4-5 cm)  Effacement (%): 80  Station:  -2  Presentation: Vertex  Method: Ultrasound  OB Examiner: K. Woodrum RN   FHT:   Category I         Labs:     Results       Procedure Component Value Units Date/Time    Urine Drug Screen - No Confirmation [[id number]] Collected: 05/13/22 0510    Specimen: Urine, Random Updated: 05/13/22 0600     Propoxyphene PENDING     Urine Barbiturates PENDING     Urine Benzodiazepines PENDING     Urine Cannabinoids PENDING     Urine Cocaine PENDING     Urine Methadone Screen PENDING     Urine Opiates PENDING     Urine Oxycodone PENDING     Urine Phencyclidine PENDING     pH, Urine PENDING     Urine Specific Gravity PENDING     Urine Buprenorphine PENDING     Urine Ecstasy Screen PENDING     Urine Fentanyl Screen PENDING     Urine Amphetamine PENDING     Urine Creatinine Random 135 mg/dL     Narrative:      This is a screening test only.  Any presumptive positive result should be confirmed by more definitive methodologies. Any unconfirmed results should be used for medical purposes only.  Samples with creatinine concentrations <20 mg/dl are considered suspect and resubmission is recommended.  The following thresholds are the minimal levels for detection:  Amphetamine    1000 ng/mL  Methadone     300 ng/mL  Barbiturate     200 ng/mL  Opiate        300 ng/mL  Benzodiazepine  200 ng/mL  Oxycodone     100 ng/mL  Cannabinoid      50 ng/mL  Phenyclidine   25 ng/mL  Cocaine         300 ng/mL  Propoxyphene  300 ng/mL  Buprenorphine     5 ng/mL  Ecstasy       500 ng/mL  Fentanyl          2 ng/mL    Type and Screen [[id number]] Collected: 05/13/22 0510    Specimen: Blood Updated: 05/13/22 0552     ABO Rh A Positive     AB Screen PENDING    CBC and differential [[id number]]  (Abnormal) Collected: 05/13/22 0510    Specimen: Blood Updated: 05/13/22 0544     WBC 8.2 K/cmm      RBC 3.90 M/cmm      Hemoglobin 10.3 gm/dL      Hematocrit 78.2 %      MCV 80 fL      MCH 26 pg      MCHC 33 gm/dL      RDW 95.6 %      PLT CT 191 K/cmm      MPV 9.4 fL       Neutrophils % 58.8 %      Lymphocytes 32.9 %      Monocytes 5.2 %      Eosinophils % 3.0 %      Basophils % 0.1 %      Neutrophils Absolute 4.8 K/cmm      Lymphocytes Absolute 2.7 K/cmm      Monocytes Absolute 0.4 K/cmm      Eosinophils Absolute 0.2 K/cmm      Basophils Absolute 0.0 K/cmm             ABO Rh   Date Value Ref Range Status   05/13/2022 A Positive  Final     Rubella IgG   Date Value Ref Range Status   10/01/2021 Immune  Final     Hepatitis B Surface Antigen   Date Value Ref Range Status   10/01/2021 Negative  Final     GBS Transcribed   Date Value Ref Range Status   04/24/2022 Positive  Final     RPR   Date Value Ref Range Status   10/01/2021 Nonreactive Borderline, Nonreactive Final       Prenatal Results       Initial Prenatal Labs       Test Value Reference Range Date Time    Type & Rh  A Positive   05/13/22 0510    [facility] - Blood Type        [facility] - Rh Factor        Antibody Screen        HCT  47.0 % 36.0 - 48.0 09/25/21 1805       44.1 % 36.0 - 48.0 09/20/21 1055    HGB  15.7 gm/dL 21.3 - 08.6 57/84/69 6295       14.5 gm/dL 28.4 - 13.2 44/01/02 7253    PLT  349 K/cmm 130 - 440 09/25/21 1805       307 K/cmm 130 - 440 09/20/21 1055    RPR ^ Nonreactive  Borderline, Nonreactive 10/01/21     Rubella ^ Immune   10/01/21     HBsAg ^ Negative   10/01/21     HIV ^ non reactive   10/01/21     [facility] - HIV        Urine Culture        [facility] - Urine Culture        Gonorrhea ^ negative   10/01/21     Chlamydia ^ negative   10/01/21  [facility] - Gonorrhea        [facility] - Chlamydia        Pap        [facility] - Pap Smear        UDS  (See Report)    05/13/22 0510       Abnormal   (See Report)    05/09/22 2230       Abnormal   (See Report)    09/26/21 1318       Abnormal   (See Report)    09/20/21 1202    [facility] - UDS                  Additional Early Labs/Tests       Test Value Reference Range Date Time    HGBA1C        TSH        Hep B Surface Ag ^ Negative   10/01/21     Hep C Ab ^ negative   10/01/21      Toxoplasma IGG        [facility] - Toxoplasma IgG        Toxoplasma IGM        [facility] - Toxoplasma IgM                  Additional Labs/Tests       Test Value Reference Range Date Time    Was Zika testing done?        Zika test Method?        Zika test Result        Nuchal Translucency        MSAFP        Trisomy 13/18 Risk        Sequential Screen        NIPS        FFN        CVS        Amniocentesis                  2nd Trimester       Test Value Reference Range Date Time    VH Quad Screen        [facility] - Quad Screen        HCT  31.0 % 36.0 - 48.0 05/13/22 0510    HGB  10.3 gm/dL 16.1 - 09.6 04/54/09 8119    PLT  191 K/cmm 130 - 440 05/13/22 0510    Antibody Screen  PENDING   05/13/22 0510              2nd Trimester Glucose Screening       Test Value Reference Range Date Time    Fasting Glucose        1 HR GTT        2 HR GTT        3 HR GTT                  3rd Trimester       Test Value Reference Range Date Time    HCT  31.0 % 36.0 - 48.0 05/13/22 0510    HGB  10.3 gm/dL 14.7 - 82.9 56/21/30 8657    PLT  191 K/cmm 130 - 440 05/13/22 0510    Urine Culture        [facility] - Urine Culture        Gonorrhea        Chlamydia  [facility] - Gonorrhea        [facility] - Chlamydia        GBS        GBS (External result) ^ Positive   04/24/22               Genetic Screening       Test Value Reference Range Date Time    Cystic Fibrosis        Canavan        Thalassemia        Sickle Cell        Tay-Sachs        HGB Electrophoresis        Ashkenazi Jewish Panel                  Legend    ^: Historical                            Ultrasound:     Assessment/Plan:     Lemoyne Scarpati is a 21 y.o. G67P0000 female with a gestation age of [redacted]w[redacted]d and Estimated Date of Delivery: 05/13/22 who presents in labor.  Will admit for delivery. Fetal status is reassuring.  Anesthesia per patient's desire.     GBS status:Positive, will treat    CEFW: 7.5 lbs.    Dava Najjar, MD, MD  05/13/2022 6:03 AM

## 2022-05-13 NOTE — Transfer of Care (Signed)
Anesthesia Transfer of Care Note    Patient: Kim Howe    Last vitals:   Vitals:    05/13/22 1324   BP: 138/79   Pulse: (!) 107   Resp: 19   Temp: 36.5 C (97.7 F)       Oxygen: Nasal Cannula     Mental Status:awake    Airway: Natural    Cardiovascular Status:  stable

## 2022-05-13 NOTE — Progress Notes (Signed)
LABOR NOTE    VITALS:    Vitals:    05/13/22 0939   BP: 116/63   Pulse: 80   Resp:    Temp:        The fetal heart tracing showed: Baseline Rate: 130 BPM, Variability: Moderate, Pattern: Accelerations, FHR Category: Category I    CERVICAL EXAM:        FHR Category: Category I    Contraction Frequency: 2-2.5    Membrane Status: Intact         Dilation: 6    Effacement (%): 100    Station: -2  AROM clear    Pain management: has gotten epidural    Impression:  IUP at term, active labor, progressing normally    Plan:  Continue expectant management.       Everett Graff, MD  05/13/2022 9:44 AM

## 2022-05-13 NOTE — Plan of Care (Signed)
Problem: Vaginal/Cesarean Delivery  Goal: Maternal Status within defined parameters  05/13/2022 1304 by Army Fossa, RN  Outcome: Completed  05/13/2022 0757 by Army Fossa, RN  Outcome: Progressing  Flowsheets (Taken 05/13/2022 0757)  Maternal status with defined parameters:   Monitor/assess vital signs   Perform physical assessment per phase of care   Manage complications/co-morbidities per LIP orders  Goal: Evidence of Fetal Well Being  05/13/2022 1304 by Army Fossa, RN  Outcome: Completed  05/13/2022 0757 by Army Fossa, RN  Outcome: Progressing  Flowsheets (Taken 05/13/2022 [phone])  Evidence of fetal well being:   Monitor/assess fetal heart rate. Notify LIP of Category II or III EFM tracings.   Initiate interventions for Category II or III EFM strip   Patient's position should support labor progress and expulsion efforts   Position patient for maximum uterine perfusion  Goal: Free from Maternal/Fetal Infection  05/13/2022 1304 by Army Fossa, RN  Outcome: Completed  05/13/2022 0757 by Army Fossa, RN  Outcome: Progressing  Flowsheets (Taken 05/13/2022 0757)  Free from Maternal/Fetal Infection:   Assess for infection risk using the appropriate screening tool   Determine GBS status. If GBS positive, manage per LIP orders.  Goal: Intrapartum management of pain/discomfort  05/13/2022 1304 by Army Fossa, RN  Outcome: Completed  05/13/2022 0757 by Army Fossa, RN  Outcome: Progressing  Flowsheets (Taken 05/13/2022 [phone])  Intrapartum management of pain/discomfort:   Keep pain at acceptable level for patient   Assess pain using a consistent, developmental/age appropriate pain scale   Assess pain level before and following intervention   Monitor hemodynamic parameters in response to pain/sedation medications   Assess and manage side effects associated with pain management   Include patient/patient care companion in decisions related to pain management   Support patient for natural childbirth with  non-pharmacologic pain management or with desired pharmacologic management such as epidural   Report ineffective pain management to LIP   Consult/collaborate with Anesthesia or Pain Service

## 2022-05-13 NOTE — Anesthesia Preprocedure Evaluation (Addendum)
Anesthesia Evaluation    AIRWAY    Mallampati: III    TM distance: >3 FB  Neck ROM: full  Mouth Opening:full   CARDIOVASCULAR    regular and normal       DENTAL    no notable dental hx               PULMONARY    pulmonary exam normal     OTHER FINDINGS                                      Relevant Problems   No relevant active problems       PSS Anesthesia Comments: Due to the nature of the electronic record, every data point may not be accurate.  Every attempt is made to ensure the record's accuracy.    PMH has been thoroughly reviewed.    Latina Craver, DO  05/13/2022                 Anesthesia Plan    ASA 2     epidural                     Detailed anesthesia plan: epidural        Post op pain management: epidural    informed consent obtained      pertinent labs reviewed             Signed by: Latina Craver, DO 05/13/22 6:13 AM

## 2022-05-13 NOTE — Anesthesia Procedure Notes (Signed)
Epidural    Patient location during procedure: L&D  Reason for block: Labor or C-section  Block at Surgeon's request: Yes    Start time: 05/13/2022 6:20 AM  End time: 05/13/2022 6:26 AM    Staffing  Performed: anesthesiologist   Anesthesiologist: Latina Craver, DO  Performed by: Latina Craver, DO  Authorized by: Latina Craver, DO      Pre-procedure Checklist   Completed: patient identified, site marked, surgical consent, pre-op evaluation, timeout performed, risks and benefits discussed, monitors and equipment checked and correct site      Epidural  Patient monitoring: Pulse oximetry and NIBP    Premedication: No  Patient position: sitting    Skin Local: lidocaine 1%    Attempts  Number of attempts: 2    Attempt 1  Interspace: L3-4  Approach: midline                Successful attempt  Interspace: L2-3  Approach: midline    Needle type: Touhy needle   Needle gauge: 17  Injection technique: LOR air  Epidural Space ID: 6 cm  CSF Return: No   Blood Return: No  Paresthesia Pain: no    Needle Placement  Needle type: Touhy needle   Needle gauge: 17  Injection technique: LOR air  CSF Return: No  Blood Return: No          Paresthesia Pain: no    Catheter Placement   Catheter type: end hole  Catheter at skin depth: 12 cm  CSF Return: No  Blood Return: No  Test Dose:1.5 % lidocaine and negative    Slow fractionated injection: no    No Catheter IV/SA Signs or Symptoms    Assessment   Sensory level: T6  Block Outcome: patient tolerated procedure well, no complications and successful block  Additional Notes  Bolused with 10 ml of 0.25% Bupivacaine slowly.  She tolerated the procedure well.  Epidural secured with tape.  IV pump setting reviewing with nurse.  PCEA started at 10 mL/hr with 5 ml bolus, q 15 min, with lockout of 30 mL per hour.

## 2022-05-13 NOTE — Plan of Care (Signed)
Problem: Vaginal/Cesarean Delivery  Goal: Postpartum management of pain/discomfort  Description: Interventions:  1. Assess pain using a consistent, developmental/age appropriate pain scale  2. Assess pain level before and following intervention  3. Include patient/patient care companion in decisions related to pain management  4. Monitor for post anesthesia issues related to pain management  5. Offer non-pharmacologic pain management interventions  6. Monitor pain infusion pumps  7. Report ineffective pain management to LIP  8. Consult/Collaborate with Anesthesia or Pain Service  Outcome: Progressing  Goal: Breasts are soft with nipple integrity intact  Description: Interventions:  1. Perform breast/nipple assessment  2. Assess and manage engorgement  3. Breastfeed and/or pump breasts at least 8-12 times within 24 hours  4. Ensure proper positioning and latch  5. Provide pharmacologic and non-pharmacologic interventions as needed  Outcome: Progressing  Goal: Gastrointestinal/Urinary management  Description: Interventions:   1. Use CAUTI prevention techniques  2. Maintain urine output of 30/mL per hour or greater  3. Monitor intake and output per orders  4. Adequate bladder emptying per phase of care  5. Offer pharmacologic and non-pharmacologic GI management interventions  Outcome: Progressing  Goal: Uterine management  Description: Interventions:  1. Assess fundus and notify LIP if not firm, midline, or at or below the umbilicus, or if abdomen is abnormally distended  2. Assess for hemorrhage risk using appropriate screening tool  Outcome: Progressing  Goal: Perineum will be clean, dry, and intact and without discharge or hematoma  Description: Interventions:  1. Place cold pack on perineum  2. Provide pericare  3. Sitz bath PRN as ordered by LIP  4. Monitor wound/incision for signs of infections  5. Assess for hemorrhoids and provide interventions  Outcome: Progressing  Goal: Incision will be clean, dry, and  intact and without discharge or hematoma  Description: Interventions:  1. Assess abdominal dressing and incision  2. Monitor incision for signs of infections  3. Assess incision for bleeding/hematoma  4. Assess incision for evidence of healing  Outcome: Progressing  Goal: VTE Prevention  Description: Interventions:  1. Administer anticoagulant(s) and/or apply anti-embolism stockings/devices as ordered  2. Patient ambulating with assistance  3. Patient ambulating independently  4. Assess skin color, turgor, peripheral pulses, perfusion, and presence of edema, e.g. capillary refill  Outcome: Progressing  Goal: Evidence of positive mother-baby interactions  Description: Interventions:  1. Include patient/patient care companion in decisions related to care  2. Initiate skin to skin  3. Initiate safety and falls prevention interventions  4. Assess emotional status and coping mechanisms  5. Encourage rooming in and infant feeding on demand  6. Ensure parent/caregiver provides infant care  7. Assess parent/caregiver engagement and awareness of infant cues/behavior  8. Notify LIP and case management if risk factors are identified  Outcome: Progressing

## 2022-05-13 NOTE — Plan of Care (Signed)
Problem: Vaginal/Cesarean Delivery  Goal: Maternal Status within defined parameters  Outcome: Progressing  Flowsheets (Taken 05/13/2022 0757)  Maternal status with defined parameters:   Monitor/assess vital signs   Perform physical assessment per phase of care   Manage complications/co-morbidities per LIP orders  Goal: Evidence of Fetal Well Being  Outcome: Progressing  Flowsheets (Taken 05/13/2022 0757)  Evidence of fetal well being:   Monitor/assess fetal heart rate. Notify LIP of Category II or III EFM tracings.   Initiate interventions for Category II or III EFM strip   Patient's position should support labor progress and expulsion efforts   Position patient for maximum uterine perfusion  Goal: Free from Maternal/Fetal Infection  Outcome: Progressing  Flowsheets (Taken 05/13/2022 0757)  Free from Maternal/Fetal Infection:   Assess for infection risk using the appropriate screening tool   Determine GBS status. If GBS positive, manage per LIP orders.  Goal: Intrapartum management of pain/discomfort  Outcome: Progressing  Flowsheets (Taken 05/13/2022 0757)  Intrapartum management of pain/discomfort:   Keep pain at acceptable level for patient   Assess pain using a consistent, developmental/age appropriate pain scale   Assess pain level before and following intervention   Monitor hemodynamic parameters in response to pain/sedation medications   Assess and manage side effects associated with pain management   Include patient/patient care companion in decisions related to pain management   Support patient for natural childbirth with non-pharmacologic pain management or with desired pharmacologic management such as epidural   Report ineffective pain management to LIP   Consult/collaborate with Anesthesia or Pain Service

## 2022-05-14 ENCOUNTER — Inpatient Hospital Stay: Payer: BC Managed Care – PPO

## 2022-05-14 LAB — CBC AND DIFFERENTIAL
Basophils %: 0.2 % (ref 0.0–3.0)
Basophils Absolute: 0 10*3/uL (ref 0.0–0.3)
Eosinophils %: 0.2 % (ref 0.0–7.0)
Eosinophils Absolute: 0 10*3/uL (ref 0.0–0.8)
Hematocrit: 22.2 % — ABNORMAL LOW (ref 36.0–48.0)
Hemoglobin: 7.2 gm/dL — ABNORMAL LOW (ref 12.0–16.0)
Lymphocytes Absolute: 2.1 10*3/uL (ref 0.6–5.1)
Lymphocytes: 14.7 % — ABNORMAL LOW (ref 15.0–46.0)
MCH: 26 pg — ABNORMAL LOW (ref 28–35)
MCHC: 33 gm/dL (ref 32–36)
MCV: 81 fL (ref 80–100)
MPV: 9.7 fL (ref 6.0–10.0)
Monocytes Absolute: 1 10*3/uL (ref 0.1–1.7)
Monocytes: 7.1 % (ref 3.0–15.0)
Neutrophils %: 77.7 % (ref 42.0–78.0)
Neutrophils Absolute: 11.2 10*3/uL — ABNORMAL HIGH (ref 1.7–8.6)
PLT CT: 182 10*3/uL (ref 130–440)
RBC: 2.74 10*6/uL — ABNORMAL LOW (ref 3.80–5.00)
RDW: 15.1 % — ABNORMAL HIGH (ref 11.0–14.0)
WBC: 14.4 10*3/uL — ABNORMAL HIGH (ref 4.0–11.0)

## 2022-05-14 NOTE — Progress Notes (Signed)
Postoperative Day #1 C-section      Subjective:     Patient doing well. She is tolerating a regular diet without nausea/vomiting. She is ambulating. She is voiding spontaneously. She reports normal lochia. Pain is well controlled with PO meds. Patient is breastfeeding. She is not yet passing flatus.    Objective:     Temp Readings from Last 3 Encounters:   05/14/22 98.6 F (37 C) (Temporal)   05/12/22 97.9 F (36.6 C) (Oral)   05/09/22 98.1 F (36.7 C) (Oral)     BP Readings from Last 3 Encounters:   05/14/22 103/62   05/12/22 112/80   05/09/22 117/71     Pulse Readings from Last 3 Encounters:   05/14/22 74   05/12/22 77   05/09/22 77        Physical Exam:     Appearance/Psychiatric: to be in no distress  Constitutional: The patient is well nourished.  Cardiovascular: no pedal edema.  Respiratory: respiratory effort is normal, no respiratory distress  Gastrointestinal: Soft, appropriately tender.   Fundus--firm NT at Umbilicus -1  Extremities--NT, no cyanois, or clubbing  Incision--clean, dry, and intact     Lab Results   Component Value Date    HGB 7.2 (L) 05/14/2022    HCT 22.2 (L) 05/14/2022        Assessment/Plan:     Impression:  POD #1 s/p C-section.  Doing well.    Plan:      Postpartum Care  -continue routine postpartum care  -discontinue foley catheter  -SCDs while supine in bed, encouraged ambulation   -advance diet as tolerated  -patient undecided for contraception  -circumcision desired, consent signed  -plans to breastfeed baby  -anticipate D/C home tomorrow    H/o intracranial hypertension  -VP shunt removed at time of surgery  -neurosurgery consulted, appreciate recommendations      Dicky Doe, DO  05/14/2022 7:02 AM

## 2022-05-14 NOTE — Consults (Signed)
Name: Kim Howe, Kim Howe    Age: 21 y.o.  DOB: 29-Sep-2001   MRN: [id number] Hospital Day: 1 Attending: Dava Najjar, MD     [facility] Patient  For questions contact Dr. Lajean Saver via secure chat.  After hours and on weekends, please contact the neurosurgeon on call.    Consulting Neurosurgeon: Dr. Hollice Gong     Date:  05/14/22      Principal Diagnosis: Pregnancy      Active Problems:  Active Hospital Problems    Diagnosis    Pregnancy       Assessment & Plan                                                                    [place]. 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  During cesarean section, per operative note, multiple loops of VP shunt tubing seen with both tips in the peritoneal cavity.  It was removed and placed in a specimen bag.     Per review of Epic patient has history of right parietal-occipital programmable shunt VP. Patient states her predominant symptoms prior to shunt placement was visual changes/loss    Neurosurgery was consulted to evaluate and provide recommendations to determine if neurosurgical intervention warranted.     -Shunt series and CTH ordered for further evaluation.       History of Present Illness     Chief Complaint: Pregnancy     Kim Howe is a 21 y.o. 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  During cesarean section, per operative note, multiple loops of VP shunt tubing seen with both tips in the peritoneal cavity.  It was removed and placed in a specimen bag. Per review of Epic patient has history of right parietal-occipital programmable shunt VP. Patient states her predominant symptoms prior  to shunt placement was visual changes/loss.  On exam patient denies any nausea, vomiting, abdominal pain, headache, visual changes including blurred vision double vision.  No neurological deficits or sensory changes.        Relevant History     Past Medical History:  She  has a past medical history of Anxiety, Depression, Intracranial hypertension, and PCOS (polycystic ovarian syndrome).     Past Surgical History:  She  has a past surgical history that includes Brain surgery and CESAREAN SECTION (N/A, 05/13/2022).     Allergies:  She is allergic to other and other animal.     Advanced Directive: full code    Relevant Home Medications     Outpatient Medications Marked as Taking for the 05/13/22 encounter [facility] Encounter)   Medication Sig Dispense Refill    Prenatal MV-Min-Fe Fum-FA-DHA (PRENATAL 1 PO) Take 1 tablet by mouth daily         Review of Systems     10 point review of systems was performed - pertinent positives and negatives are as per the HPI.  Otherwise, all other systems are negative  Objective       Imaging: I personally reviewed the images and my findings/interpretations are as follows:   Shunt series and CT head ordered for further evaluation; pending    Vitals: All available vitals signs reviewed and available in EMR    Laboratory:  All available laboratory results reviewed and available in EMR    Exam:   - General: Pleasant and in no acute distress  - Neuro  - MSE: Awake and alert  - Orientation: Oriented to Name/Place/Year, ZOX09  - CN: Intact  - Motor: 5/5 Throughout  - Reflexes: Normal  - Sensory: Intact  - Musculoskeletal: Neck Supple, no point tenderness    ____________________________________________________________    - I spent approximately  more than 60 minutes minutes on this encounter examining the patient, reviewing pertinent history, labs, radiologic tests and discussing the case with the patient, available staff and family.  Over half of this time was spent counseling.    [id number]      Case and care discussed with Dr. Lajean Saver who will additionally review diagnostics/imaging/labs, personally evaluate the patient, further determine treatment plan, and amend this note as indicated. Thank you for this consultation.       Signed by: Sharlene Dory, NP-C   Neurosurgery

## 2022-05-14 NOTE — Lactation Note (Signed)
Arrived at mother's bedside after parents called for assistance with breastfeeding.  I instructed and assisted patient with asymmetric latch on left breast in football hold.  Infant was placed skin to skin.  I instructed and aided mother in aligning infant with nose to nipple and demonstrated how to wait and tease infant until his mouth was opened as wide as possible.  Infant did gape when provided with circumoral tactile stimulation, and his gape widened with every latch attempt.  He grasped teat as nipple was placed far back on his tongue establishing a deep latch to begin nursing vigorously. Newborn's lips were flanged with a wide interlabial angle greater than 120 degrees, and he sustained bursts of suckling with appropriate pauses.  Instructed mom how to gently compress the breast to facilitate flow of milk.  Pointed out how newborn's eyes opened as he tasted mother's milk.  Infant nursed for 14 minutes with multiple, audible swallows.  Upon release mother's nipple appeared rounded with no compression.  Encouraged patient to offer infant an opportunity to nurse from the right breast, but mother verbalized increasing cramping discomfort.  Mother called nurse for ibuprofen to aid in soothing cramping discomfort.     Patient had hand expressed 3.5 ml @ 1330.  Demonstrated to parents how to syringe feed infant.  Using a gloved finger to elicit infant to suck, I slowly fed infant 3.5 ml of golden yellow colostrum.  Baby Kim Howe maintained a strong grasp on my finger and moved his tongue in a smooth wave-like fashion while extending it over the alveolar ridge.    This mother's urine toxicology screen was positive for cannabinoids during her pregnancy, but negative upon this hospital admission.  Mother states that she understands and agrees to abstaining from marijuana use during the duration of breastfeeding.  She is aware that THC is fat soluble and crosses blood brain barrier and that exposure for infant is  contraindicated due to potential long term neurological effects which may not have been identified in literature.  Mother aware to discuss with infant's pediatrician medication or substances used while breastfeeding her son.    Reviewed the size of a newborn's stomach and appropriate feeding volumes since parents plan to both breast and formula feed newborn.  Parents have been provided Similac orthodontic nipples and instructed in proper use to encourage extended jaw and suckling as well as reviewed paced bottlefeeding.  Encouraged them that unless medically indicated formula is not necessary at this time.  Reviewed breastfeeding as a supply and demand system.  Recommended offering the breast prior to formula if she continues to supplement.      Reinforced number of wet and dirty diaper as well as number of feeds to expect, and mom verbalized understanding.  Discussed normal infant feeding behaviors including the potential for cluster feeding during DOL 2 and 3.      Parent understands her milk volume should increase between postpartum days 3-5 as a first time mother.  She is aware to expect her breasts to feel heavy, warm, and tender.      We discussed growth spurts (@4 -5 during first 6 months of life) and what to expect.  Also educated mother that breastfeeding is a demand and supply system and that infant may desire more frequent feedings 2-4 days prior to a growth spurt.    Parents plan for discharge 05/15/2022, and I reinforced the importance of following up with a pediatrician 24-48 hours after discharge as a first time breastfeeding mother as recommended  by the Academy of Breastfeeding Medicine.  Infant's most recent weight is 6 lb 12.6 oz which is a 0% loss since birth.  Parents understand infant may lose up to 7-10% but should regain back to birth weight by 89 weeks of age.    I referred patient to the breastfeeding chapter in Your Guide to Postpartum and Newborn Care booklet and to the interactive learning  applications therein.  Patient is aware of lactation consult availability and parent has LC mobile phone number for assistance in the hospital.  Printed community resources for follow up provided. Patient was advised to call if assistance is needed or desired.     05/14/22 1600   Lactation Consultation   Visit Type  Follow Up   Maternal Information   Breastfeeding Status Yes   Previous Breastfeeding Experience No   Taking meds incompatible with breastfeeding N   Storing milk  N   Pumping Experience N   Pumping this hospitalization N   Dumping Milk N   Newborn Feeding Preference   Which feeding method will be used? Both - Breast milk and formula   Breast Assessment   Left Breast Large;Pendulous;Pliable;Colostrum Present   Left Nipple Medium;Evert;Short   Right Breast Large;Pendulous;Pliable;Colostrum Present   Right Nipple Medium;Evert;Other (Comment)  (areolar edema, responds to reverse pressure softening, patient encouraged to wear a supportive bra to shift fluid away from areola)      05/14/22 1600   Lactation Consultation   Visit Type  Follow Up   Feeding Assessment   Infant Level of Arousal Easily aroused to stimulation;Infant placed skin to skin;Encouraged skin to skin   Feeding Positions Used Football   Alignment Straight line at breast level;Nose to nipple;Chin touching breast   Areolar Grasp Lips flanged;Asymmetric latch   Areolar Compression Rhythmic;Long jaw movements   Suck/Swallow Audible swallows;Visual swallows;Coordinated;Multiple swallows   Feeding Information   Feeding Tolerance Tolerates well     Kim Howe, RNC, IBCLC

## 2022-05-14 NOTE — Consults (Signed)
See lactation note.

## 2022-05-14 NOTE — Lactation Note (Addendum)
In to see dyad for ordered lactation consult. Mother is a P1 with infant born at 40 weeks, birthweight 6 lb 12.6 oz, currently 4 hours old. She shares that her goal is to breast and formula feed; infant has not yet latched. Mom states she did have breast changes during pregnancy and noticed colostrum leaking as well. Encouraged her that unless medically indicated formula is not necessary at this time. Reviewed breastfeeding as a supply and demand system. Recommended offering the breast prior to formula if she continues to supplement.      Mother began to experience pain in her cesarean incision during the middle of consult. Primary RN notified.     Demonstrated hand expression to express colostrum for infant. Praised mom in her ease of hand expression. 3.5 mL of cloudy yellow colostrum yielded. Mother's right areola is dense and edematous. Unable to hand express on the right breast.    Infant was rooting at the time of visit and mother accepted assistance with latching. I introduced the fundamental skills of positioning and latching an inexperienced infant asymmetrically in cross cradle hold:    1.  Sitting upright  2.  Supporting and aligning infant with breast at rest  3.  Breast shaping parallel with infant's lips  4.  Holding infant closely to her chest, nipple pointing toward nose, infant's lips "kissing" areola and chin touching breast  5.  Waiting for wide open mouth (gape) stimulated by tactile stim of rooting reflex  6.  Planting lower lip of wide open mouth as far from base of nipple as possible, and using this point as a fulcrum to fold nipple far back onto tongue  7.  Assess for feeling of strong pull and absence of pain or pain that is quickly subsiding  8.  Assess for swallowing by feel, sound, and sight  9.  Assessing shape of nipple for lack of distortion when released by infant    Mother was attentive and verbalized understanding and demonstrated developing skill. I encouraged her to focus on  getting infant's mouth open wide prior to latching and on having chin and lower lip land as far from base of nipple as possible. Also discussed use of gentle chin pressure to increase interlabial angle and flange lower lip outward as needed. Infant nursed on the left breast for 5 minutes with multiple audible swallows before self releasing. Infant appear satiated.     Mother has LC contact phone number for assistance in hospital and community resources for after discharge. No needs at this time. Will return to finish education later today if mother's pain is resolved.     05/14/22 1400   Lactation Consultation   Visit Type  Initial Assessment   Maternal Information   Breastfeeding Status Yes   Previous Breastfeeding Experience No   Taking meds incompatible with breastfeeding N   Storing milk  N   Pumping Experience N   Pumping this hospitalization N   Dumping Milk N   Newborn Feeding Preference   Which feeding method will be used? Both - Breast milk and formula   Breast Assessment   Left Breast Large;Pendulous;Pliable;Colostrum Present   Left Nipple Medium;Evert;Short   Right Breast Large;Pendulous;Colostrum Present  (edematous)   Right Nipple Medium;Flat;Dense Tissue  (edematous)   Hand expression done (if baby in NICU) Yes  (left side only. right side is too edematous)     Gena Fray, RN

## 2022-05-14 NOTE — Plan of Care (Signed)
Problem: Vaginal/Cesarean Delivery  Goal: Postpartum management of pain/discomfort  Description: Interventions:  1. Assess pain using a consistent, developmental/age appropriate pain scale  2. Assess pain level before and following intervention  3. Include patient/patient care companion in decisions related to pain management  4. Monitor for post anesthesia issues related to pain management  5. Offer non-pharmacologic pain management interventions  6. Monitor pain infusion pumps  7. Report ineffective pain management to LIP  8. Consult/Collaborate with Anesthesia or Pain Service  Outcome: Progressing  Goal: Breasts are soft with nipple integrity intact  Description: Interventions:  1. Perform breast/nipple assessment  2. Assess and manage engorgement  3. Breastfeed and/or pump breasts at least 8-12 times within 24 hours  4. Ensure proper positioning and latch  5. Provide pharmacologic and non-pharmacologic interventions as needed  Outcome: Progressing  Goal: Gastrointestinal/Urinary management  Description: Interventions:   1. Use CAUTI prevention techniques  2. Maintain urine output of 30/mL per hour or greater  3. Monitor intake and output per orders  4. Adequate bladder emptying per phase of care  5. Offer pharmacologic and non-pharmacologic GI management interventions  Outcome: Progressing  Goal: Uterine management  Description: Interventions:  1. Assess fundus and notify LIP if not firm, midline, or at or below the umbilicus, or if abdomen is abnormally distended  2. Assess for hemorrhage risk using appropriate screening tool  Outcome: Progressing  Goal: Perineum will be clean, dry, and intact and without discharge or hematoma  Description: Interventions:  1. Place cold pack on perineum  2. Provide pericare  3. Sitz bath PRN as ordered by LIP  4. Monitor wound/incision for signs of infections  5. Assess for hemorrhoids and provide interventions  Outcome: Progressing  Goal: Incision will be clean, dry, and  intact and without discharge or hematoma  Description: Interventions:  1. Assess abdominal dressing and incision  2. Monitor incision for signs of infections  3. Assess incision for bleeding/hematoma  4. Assess incision for evidence of healing  Outcome: Progressing  Goal: VTE Prevention  Description: Interventions:  1. Administer anticoagulant(s) and/or apply anti-embolism stockings/devices as ordered  2. Patient ambulating with assistance  3. Patient ambulating independently  4. Assess skin color, turgor, peripheral pulses, perfusion, and presence of edema, e.g. capillary refill  Outcome: Progressing  Goal: Evidence of positive mother-baby interactions  Description: Interventions:  1. Include patient/patient care companion in decisions related to care  2. Initiate skin to skin  3. Initiate safety and falls prevention interventions  4. Assess emotional status and coping mechanisms  5. Encourage rooming in and infant feeding on demand  6. Ensure parent/caregiver provides infant care  7. Assess parent/caregiver engagement and awareness of infant cues/behavior  8. Notify LIP and case management if risk factors are identified  Outcome: Progressing

## 2022-05-14 NOTE — Plan of Care (Signed)
Reviewed plan of care. Vitals and lochia WNL. Encouraged skin to skin, ambulation, and use of IS. Notify nurse of needs, questions and concerns. Call light within reach and patient instructed on it's use. Plan of care updated and reviewed with patient.      Problem: Vaginal/Cesarean Delivery  Goal: Postpartum management of pain/discomfort  Outcome: Progressing  Goal: Breasts are soft with nipple integrity intact  Outcome: Progressing  Goal: Gastrointestinal/Urinary management  Outcome: Progressing  Goal: Uterine management  Outcome: Progressing  Goal: Perineum will be clean, dry, and intact and without discharge or hematoma  Outcome: Progressing  Goal: Incision will be clean, dry, and intact and without discharge or hematoma  Outcome: Progressing  Goal: VTE Prevention  Outcome: Progressing  Goal: Evidence of positive mother-baby interactions  Outcome: Progressing

## 2022-05-14 NOTE — UM Notes (Signed)
[facility] Utilization Management Review Sheet    Facility :  [facility]    NAME: Kim Howe  MR#: [id number]    CSN#: [id number]    ROOM: 202/202-A AGE: 21 y.o.    Date of Birth: Jun 30, 2001    ADMIT DATE AND TIME: 05/13/2022  4:30 AM    ATTENDING PHYSICIAN: Dava Najjar, MD  PAYOR:Payor: OUT OF STATE BLUE CROSS / Plan: BCBS OUT OF STATE PPO / Product Type: BCBS /       AUTH #:     DIAGNOSIS:     HISTORY:   Past Medical History:   Diagnosis Date    Anxiety     Depression     Intracranial hypertension     PCOS (polycystic ovarian syndrome)        DATE OF REVIEW: 05/14/2022    VITALS: BP 105/65   Pulse 68   Temp 97.5 F (36.4 C) (Temporal)   Resp 16   Ht 1.6 m (5\' 3" )   Wt 83.9 kg (185 lb)   SpO2 100%   Breastfeeding Yes   BMI 32.77 kg/m     Active Hospital Problems    Diagnosis    Pregnancy     [id number]:  Admitted in labor. C/S for NRFHT.  G1P1  EDC:  [id number]  C/S on 062723 @ 12:33 pm  Female 6 lbs 12.6 oz 3080 g  Apgar:  8/9  Gestation:  40 wks 0 days    MCG:  S-350    Rush Barer. Kamren Heskett, Airline pilot [phone]  Fax:  [phone]  [email or web address]

## 2022-05-14 NOTE — Consults (Signed)
05/14/22 1200   Baseline Information   Mother's Age 22-25   State of Residence [place]   Mother's Zip Code [address]   Mother's County of Residence [place]   Mother's Type of Insurance Commerical   Number of Children in Mother's Custody 1   Mother's Prior Hx of Substance Abuse Yes   Age of Mother's Prior Substance Abuse 15-17   CPS Referral Made for this Newborn Yes   Community Service Board (CSB) Referral Yes   Infant Toddler Connection (ITC) Referral Yes   Substance Exposure   Single Stubstance Exposure Yes   Exposured Substance Cannabinoid   Cannabinoids Not Prescribed   Infant Management   Baby Disposition Discharge to Home: Normal Newborn Follow Up & Standard Referrals (CPS/ITC/CSB)   Baby Discharge Status Mother's Custody   Mother's Custody Without CPS Safety Plan           [facility]   [address]   [place] [address]      Assessment  Case Management / Social Work      Infant Name: Leon Norman Ammar     Present for Interview: MOB/ FOB/ infant      Reason for Consult:  Substance Exposed Infant; PPD=12     If substance use, dates and substances MOB positive for:   THC in PNC, stopped in May and was negative on admission.      Mental Health/Substance Abuse History:  MOB reports history of anxiety/ depression but has not been seen/ treated in several years. She reports knowing some info about PPD, was accepting of the PPD resource packet provided by this writer. MOB reports no SI/HI. SW encouraged pt to go to nearest ED should she start to experience these symptoms. MOB reports intermittently smoking THC since age 16. She reports stopping a few weeks ago and does not intend to resume while providing breast milk to her infant.     Support System:  MOB reports good supports from FOB Evan Cornwell 03/25/1998 and family.     Prior CPS Involvement: NA this is first baby     Does MOB use while other children are home? If so, where are other children while MOB is using: NA first baby      About other caregivers in home (Are there other caregivers while MOB Is using? Do the other caregivers in the home use substances?): FOB sometimes smokes, also goes outside     Supports/Strengths: MOB reports having all needed supplies for infant. She has good supports, FOB involvement. MOB stopped smoking THC and does not plan to resume at this time. MOB reports employed with plan to return.      Concerns: No further concerns      Script Verified: NA      Referrals to be made by SW (ITC/[facility]/CPS) and County of referral: CPS [place] DSS, [facility], ITC     Did SW explain to the Mother/pt the referrals to be made, the reason for the referrals and services offered by each agency?  Reason for referrals explained. All questions/ concerns answered at this time.      Anticipated Agency f/u?  As per Agency Protocol     Pediatrician: Dr. Duck      Anticipated [place] for Mother:  6/29        Anticipated [place] for Infant: 6/29      Prayan Ulin, MSW  Social Worker, ext [phone]  05/14/22 12:47 PM

## 2022-05-14 NOTE — Consults (Signed)
Acute Pain Management (Inpatient consult)  Consult performed by: Ledon Snare, NP  Consult ordered by: Janett Labella, MD  Reason for consult: IT morphine/spinal block      [facility] PAIN MANAGEMENT PROGRESS NOTE    CC:  Post-duramorph/opioid neuraxial injection    Kim Howe is a 21 y.o. female, POD #1 S/P Primary cesarean section with epidural block/morphine 2 mg for post operative pain management. She had an indwelling epidural catheter for labor analgesia, but required conversion to GETA for emergent surgery. Postoperatively, she reports mild soreness at her lower abdomen. First tab of oxycodone/apap given about 3 hours ago, while ketorolac IV given pre-emptively q 6 hours. Pain intensity reported at 3/10 while resting, up slightly with movement. As for side effects, no lingering issues with pruritus reported. She denies urinary retention, N/V, HA, back pain or leg numbness.  She is able to ambulate well.    Of note, patient is fearful of taking opioids and going home with it due to family issues with substance abuse. Pt's UDS negative at admission.    Past Medical History:   Diagnosis Date    Anxiety     Depression     Intracranial hypertension     PCOS (polycystic ovarian syndrome)      Past Surgical History:   Procedure Laterality Date    BRAIN SURGERY      brain shunt 3/20    CESAREAN SECTION N/A 05/13/2022    Procedure: CESAREAN SECTION;  Surgeon: Everett Graff, MD;  Location: [facility];  Service: Obstetrics;  Laterality: N/A;     Social History     Tobacco Use    Smoking status: Every Day     Packs/day: 0.30     Years: 4.00     Total pack years: 1.20     Types: Cigarettes    Smokeless tobacco: Never   Substance Use Topics    Alcohol use: Not Currently     Comment: occ     Allergy     Other and Other animal    Medication     Current Facility-Administered Medications   Medication Dose Route Frequency    ceFAZolin  2 g Intravenous Q8H    prenatal vitamin  1 tablet Oral Daily     sodium chloride (PF)  3 mL Intravenous Q12H SCH    [START ON 05/15/2022] sodium chloride (PF)  3 mL Intravenous Q12H SCH       PRN Meds: acetaminophen **OR** acetaminophen, butorphanol, calcium carbonate, diphenhydrAMINE, docusate sodium, hydrALAZINE, ibuprofen, ketorolac, measles, mumps & rubella vaccine, methylergonovine, naloxone (NARCAN) injection 0.1 mg, ondansetron **OR** ondansetron, oxyCODONE-acetaminophen, prochlorperazine, promethazine **OR** promethazine, simethicone, TdaP Booster  ROS   All other systems are negative except for those mentioned above.  Physical     BP 105/65   Pulse 68   Temp 97.5 F (36.4 C) (Temporal)   Resp 16   Ht 1.6 m (5\' 3" )   Wt 83.9 kg (185 lb)   SpO2 100%   Breastfeeding Yes   BMI 32.77 kg/m     Pain:                           Site:  Lower abdomen incision  Pain Assessment  Charting Type: Assessment (05/13/22 1930)  Pain Scale Used: Numeric Scale (0-10) (05/13/22 1930)       General appearance --WDWN, adult female, alert, well appearing, and in no distress.  Mental status -alert,  oriented to person, place, and time  Psych - appropriate affect and mood, converse well, good eye contact.  Abdomen - gravid-appearing, tender/sore as expected, no nv  Neurological - no headache or back pain.  BLE sensory grossly intact.  Musculoskeletal - Ambulated.  BLE gross motor intact.  Extremities -peripheral pulses normal, positive BLE/pedal edema, no cyanosis  Skin -normal coloration and good turgor, no rashes.     No results for input(s): "BUN", "CREAT", "AST", "ALT", "ALP" in the last 24 hours.    Invalid input(s): "BILIT"    PDMP     No results were found.  Consistent: NA    Impression   21 y.o. female with Pregnant [Z34.90]  Pregnancy [Z34.90], s/p Procedure(s):  CESAREAN SECTION, with  1.  Epidural block - fully resolved without apparent anesthesia complications  2.  Acute pain - good control     Plan     Reviewed plan of care for pain management. Continue with oxycodone/apap and  nsaids. Discussed with pt options when discharged, especially with patient avoiding going home with opioids due to family hx of substance abuse.   2. Advised patient on multimodal pain management strategies as well as side effect management. Bowel regimen discussed and encouraged use, especially with her hx of intracranial hypertension.  3. Discussed with patient pain expectations and balancing approach with meds safety/function.  4. Advised on strategies to taper off analgesics with decreasing pain and healing.  5. Patient to notify staff/providers while inpatient or when discharged for symptoms of epidural catheter placement/anesthesia complications, including new onset back pain, redness/discharge from the injection site, or BLE numbness and weakness.     [name]  Pain Service  05/14/2022     I have reviewed the above and agree    Kim Sinehomas Krupica, MD

## 2022-05-14 NOTE — Plan of Care (Signed)
Problem: Vaginal/Cesarean Delivery  Goal: Postpartum management of pain/discomfort  Outcome: Progressing  Flowsheets (Taken 05/14/2022 0528)  Postpartum management of pain/discomfort: Assess pain using a consistent, developmental/age appropriate pain scale  Goal: Breasts are soft with nipple integrity intact  Outcome: Progressing  Goal: Gastrointestinal/Urinary management  Outcome: Progressing  Flowsheets (Taken 05/14/2022 0528)  Gastrointestinal/Urinary management:   Monitor intake and output per orders   Adequate bladder emptying per phase of care  Goal: Uterine management  Outcome: Progressing  Flowsheets (Taken 05/14/2022 0981)  Uterine management:   Assess fundus and notify LIP if not firm, midline, or at or below the umbilicus, or if abdomen is abnormally distended   Assess for hemorrhage risk using appropriate screening tool  Goal: Perineum will be clean, dry, and intact and without discharge or hematoma  Outcome: Progressing  Flowsheets (Taken 05/14/2022 0528)  Perineum will be clean, dry, and intact and without discharge or hematoma: Provide pericare  Goal: Incision will be clean, dry, and intact and without discharge or hematoma  Outcome: Progressing  Flowsheets (Taken 05/14/2022 0528)  Incision will be clean, dry and intact and without discharge or hematoma:   Assess abdominal dressing and incision   Monitor incision for signs of infections   Assess incision for bleeding/hematoma   Assess incision for evidence of healing  Goal: VTE Prevention  Outcome: Progressing  Flowsheets (Taken 05/14/2022 1914)  VTE prevention:   Assess skin color, turgor, peripheral pulses, perfusion, and presence of edema, e.g. capillary refill   Patient ambulating with assistance  Goal: Evidence of positive mother-baby interactions  Outcome: Progressing  Flowsheets (Taken 05/14/2022 0528)  Evidence of positive mother-baby interactions:   Include patient/patient care companion in decisions related to care   Initiate safety and falls  prevention interventions   Initiate skin to skin   Assess emotional status and coping mechanisms   Assess parent/caregiver engagement and awareness of infant cues/behavior   Encourage rooming in and infant feeding on demand   Ensure parent/caregiver provides infant care

## 2022-05-15 ENCOUNTER — Inpatient Hospital Stay: Payer: BC Managed Care – PPO

## 2022-05-15 NOTE — Progress Notes (Signed)
Postoperative Day 2 C-section      Subjective:     Patient doing well. Patient reports normal lochia, history of depression, pain controlled with po pain meds, and desires circumcision Patient is breastfeeding. She reports flatus    Objective:     Temp Readings from Last 3 Encounters:   05/15/22 99.7 F (37.6 C) (Temporal)   05/12/22 97.9 F (36.6 C) (Oral)   05/09/22 98.1 F (36.7 C) (Oral)     BP Readings from Last 3 Encounters:   05/15/22 105/68   05/12/22 112/80   05/09/22 117/71     Pulse Readings from Last 3 Encounters:   05/15/22 72   05/12/22 77   05/09/22 77        Physical Exam:     Chest--CTA bilaterally  Cardiovascular--regular rhythm, normal rate  Fundus--firm NT at Umbilicus   Incision--clean, dry, and intact with dermabond  Ext--NT, no cyanois, clubbing or edema  Lochia--decreasing      Lab Results   Component Value Date    HGB 7.2 (L) 05/14/2022    HCT 22.2 (L) 05/14/2022        Assessment/Plan:     Impression:  POD 2 s/p C-section.  Doing well.  Shunt views show the shunt is out  Had CT this am and will await result and input from neurosurgery  She is till undecided if she wants to go home later today    Plan:  Routine PP care, anticipate D/C home today or tomorrow  She is working on the breastfeeding  Consent for the circ done        Levander Campion, MD  05/15/2022 8:44 AM

## 2022-05-15 NOTE — Progress Notes (Signed)
Name: Kim Howe, Kim Howe    Age: 21 y.o.  DOB: February 22, 2001   MRN: [id number] Hospital Day: 2 Attending: Dava Najjar, MD     Date:  05/15/22      Principal Diagnosis: Pregnancy     24 Hr Interval History     Shunt series and CTH completed     Assessment & Plan                                                                    No intervention needed.   Complete removal of VP shunt tubing with intracranial aspect of shunt unchanged.   Patient remains asymptomatic and does not require tubing replacement.       Objective       Vitals: All available vitals signs reviewed and available in EMR    Laboratory:  All available laboratory results reviewed and available in EMR    Exam:             - Cranial: Intact. No deficits.              - Motor: Full strength. No deficits.        - Sensory:  Intact       ________________________________________________________________    - I spent approximately  30 minutes on this encounter examining the patient, reviewing pertinent history, labs, radiologic tests and discussing the case with the patient, available staff and family.  Over half of this time was spent counseling.    Signed by: Sharlene Dory, NP-C   Neurosurgery

## 2022-05-15 NOTE — Plan of Care (Signed)
Problem: Vaginal/Cesarean Delivery  Goal: Postpartum management of pain/discomfort  Description: Interventions:  1. Assess pain using a consistent, developmental/age appropriate pain scale  2. Assess pain level before and following intervention  3. Include patient/patient care companion in decisions related to pain management  4. Monitor for post anesthesia issues related to pain management  5. Offer non-pharmacologic pain management interventions  6. Monitor pain infusion pumps  7. Report ineffective pain management to LIP  8. Consult/Collaborate with Anesthesia or Pain Service  Outcome: Progressing  Goal: Breasts are soft with nipple integrity intact  Description: Interventions:  1. Perform breast/nipple assessment  2. Assess and manage engorgement  3. Breastfeed and/or pump breasts at least 8-12 times within 24 hours  4. Ensure proper positioning and latch  5. Provide pharmacologic and non-pharmacologic interventions as needed  Outcome: Progressing  Goal: Gastrointestinal/Urinary management  Description: Interventions:   1. Use CAUTI prevention techniques  2. Maintain urine output of 30/mL per hour or greater  3. Monitor intake and output per orders  4. Adequate bladder emptying per phase of care  5. Offer pharmacologic and non-pharmacologic GI management interventions  Outcome: Progressing  Goal: Uterine management  Description: Interventions:  1. Assess fundus and notify LIP if not firm, midline, or at or below the umbilicus, or if abdomen is abnormally distended  2. Assess for hemorrhage risk using appropriate screening tool  Outcome: Progressing  Goal: Perineum will be clean, dry, and intact and without discharge or hematoma  Description: Interventions:  1. Place cold pack on perineum  2. Provide pericare  3. Sitz bath PRN as ordered by LIP  4. Monitor wound/incision for signs of infections  5. Assess for hemorrhoids and provide interventions  Outcome: Progressing  Goal: Incision will be clean, dry, and  intact and without discharge or hematoma  Description: Interventions:  1. Assess abdominal dressing and incision  2. Monitor incision for signs of infections  3. Assess incision for bleeding/hematoma  4. Assess incision for evidence of healing  Outcome: Progressing  Goal: VTE Prevention  Description: Interventions:  1. Administer anticoagulant(s) and/or apply anti-embolism stockings/devices as ordered  2. Patient ambulating with assistance  3. Patient ambulating independently  4. Assess skin color, turgor, peripheral pulses, perfusion, and presence of edema, e.g. capillary refill  Outcome: Progressing  Goal: Evidence of positive mother-baby interactions  Description: Interventions:  1. Include patient/patient care companion in decisions related to care  2. Initiate skin to skin  3. Initiate safety and falls prevention interventions  4. Assess emotional status and coping mechanisms  5. Encourage rooming in and infant feeding on demand  6. Ensure parent/caregiver provides infant care  7. Assess parent/caregiver engagement and awareness of infant cues/behavior  8. Notify LIP and case management if risk factors are identified  Outcome: Progressing

## 2022-05-15 NOTE — Progress Notes (Signed)
NEUROSURGERY FINAL DISPOSITION NOTE  Name: Kim Howe    MR#: [id number]  DOB: Jul 17, 2001     Hospital Day: 2   Attending: Dava Najjar, MD   ___________________________________________________________________    No acute neurosurgical issues. Regular, daily visits by the neurosurgery team are therefore no longer required. Please reference consult and progress note for our evaluation and recommendations. No further indication for serial imaging (inpatient or outpatient) or need for outpatient neurosurgical follow-up. Follow-up with providers as directed by primary team.      The neurosurgery team remains available at all times for any questions or changes in the clinical care that may require reevaluation - please contact the consulting physician during regular hours or the on-call neurosurgeon after hours.  ______________________________________________________________________      Case and care discussed with Dr. Lajean Saver.     Signed by: Sharlene Dory, NP-C   Neurosurgery

## 2022-05-15 NOTE — Lactation Note (Signed)
In to follow up with dyad. Mother Kim Howe shares that infant has been latching but falls asleep after approximately 2 minutes each feeding. Parents have been bottle feeding after latching.     Infant cuing at the time of visit and mother accepted assistance with feeding. I instructed and assisted patient with asymmetric latch on right breast in cross cradle hold. Infant nursed with multiple audible and visual swallows for 6 minutes before self releasing the breast. Infant was return to mother's chest for skin to skin.     Encouraged mom to call staff for assistance with feeding as need. Mom shares that she is not feeling confident and comfortable yet latching infant on her own. She is a novice breastfeeding and still developing her skill. She does feel comfortable with hand expression and has been giving infant colostrum via oral syringe as well as bottle feeding formula. She is aware to continue offer the breast first and to focus on getting a deep latch.    Patient was informed of lactation consult availability and contact information was provided. Patient was advised to call if assistance is needed or desired. Patient acknowledged information.       05/15/22 1300   Lactation Consultation   Visit Type  Follow Up   Maternal Information   Breastfeeding Status Yes   Previous Breastfeeding Experience No   Taking meds incompatible with breastfeeding N   Storing milk  N   Pumping Experience N   Pumping this hospitalization N   Dumping Milk N   Newborn Feeding Preference   Which feeding method will be used? Both - Breast milk and formula   Breast Assessment   Left Breast Large;Pendulous;Pliable;Colostrum Present   Left Nipple Medium;Evert;Short   Right Breast Large;Pendulous;Pliable;Colostrum Present   Right Nipple Medium;Evert;Short   Hand expression done (if baby in NICU) Yes      05/15/22 1300   Lactation Consultation   Visit Type  Follow Up   Feeding Assessment   Infant Level of Arousal Awake;Infant placed skin to  skin;Encouraged skin to skin   Feeding Positions Used Cross Cradle   Alignment Straight line at breast level;Nose to nipple;Chin touching breast   Areolar Grasp Lips flanged;Asymmetric latch   Areolar Compression Rhythmic;Long jaw movements   Suck/Swallow Audible swallows;Visual swallows;Coordinated;Multiple swallows   Feeding Information   Feeding Tolerance Tolerates well     Gena Fray, RN

## 2022-05-16 DIAGNOSIS — O368390 Maternal care for abnormalities of the fetal heart rate or rhythm, unspecified trimester, not applicable or unspecified: Secondary | ICD-10-CM | POA: Diagnosis not present

## 2022-05-16 DIAGNOSIS — O99892 Other specified diseases and conditions complicating childbirth: Secondary | ICD-10-CM | POA: Diagnosis not present

## 2022-05-16 MED ORDER — ACETAMINOPHEN 325 MG PO TABS
325.0000 mg | ORAL_TABLET | ORAL | Status: AC | PRN
Start: 2022-05-16 — End: ?

## 2022-05-16 MED ORDER — OXYCODONE-ACETAMINOPHEN 5-325 MG PO TABS
1.0000 | ORAL_TABLET | Freq: Four times a day (QID) | ORAL | 0 refills | Status: AC | PRN
Start: 2022-05-16 — End: 2022-05-23

## 2022-05-16 MED ORDER — IBUPROFEN 600 MG PO TABS
600.0000 mg | ORAL_TABLET | Freq: Four times a day (QID) | ORAL | 1 refills | Status: AC | PRN
Start: 2022-05-16 — End: ?

## 2022-05-16 NOTE — Consults (Signed)
6/30 SW MM: CM consult placed after SW met w/ pt. PPD=12. SW discussed PPD and s/s to look out for and provided PPD resource packet at that time. No further need from SW CM at this time.     Jobie Quaker, MSW  Social Worker, ext [phone]  05/16/22 12:40 PM

## 2022-05-16 NOTE — Discharge Summary (Signed)
OBSTETRICS - DELIVERY DISCHARGE SUMMARY  05/16/2022 10:02 AM    Admission Date: 05/13/2022  Discharge Date: 05/16/22     Discharge Diagnosis:  Term intrauterine pregnancy now delivered via primary cesarean section .    Date of Delivery: 05/13/2022    Time of Delivery:  12:33 PM      Delivered By: Everett Graff   Delivery Type: Cesarean     EDC: Estimated Date of Delivery: 05/13/22 Gestational Age: [redacted]w[redacted]d  Baby: Liveborn Female ; Apgar 1 minute: 8  Apgar 5 minute: 9 ;   Birth Weight: 6 lb 12.6 oz (3080 g)     Anesthesia:   Delivery Complications: None.  Laceration:   Laceration Type: None  degree. Episiotomy: None     Feeding Method:      Patient Active Problem List   Diagnosis    Hyperemesis gravidarum    Benign intracranial hypertension    Generalized anxiety disorder    Major depressive disorder    Pregnancy    Non-reassuring fetal heart rate or rhythm affecting management of mother    Delivery by emergency cesarean section       Immunization History   Administered Date(s) Administered    COVID-19 mRNA MONOVALENT vaccine PRIMARY SERIES 12 years and above (Moderna) 100 mcg/0.5 mL 05/04/2020, 06/01/2020       Hospital Course: Kim Howe is a 21 y.o. female admitted to the [facility] for labor.  She delivered via Cesarean  delivery.     Her hospital course was complicated by fetal distress and discovery of patient's VP shunt being completely in her abdomen. Neurosurgery evaluated pt and saw no indication to replace the shunt at this time .The patient's vital signs remained stable and the patient remained afebrile throughout her hospitalization. She was discharged to home in stable condition.    Postpartum Complications: none    Discharge Medications:    Current Discharge Medication List        START taking these medications    Details   acetaminophen (TYLENOL) 325 MG tablet Take 1 tablet (325 mg) by mouth every 4 (four) hours as needed for Pain      ibuprofen (ADVIL) 600 MG tablet Take 1  tablet (600 mg) by mouth every 6 (six) hours as needed for Pain  Qty: 30 tablet, Refills: 1      oxyCODONE-acetaminophen (PERCOCET) 5-325 MG per tablet Take 1 tablet by mouth every 6 (six) hours as needed for Pain  Qty: 25 tablet, Refills: 0           CONTINUE these medications which have NOT CHANGED    Details   Prenatal MV-Min-Fe Fum-FA-DHA (PRENATAL 1 PO) Take 1 tablet by mouth daily           STOP taking these medications       doxylamine-pyridoxine (DICLEGIS) 10-10 MG Tablet Delayed Response              Discharge Followup:  Unless otherwise instructed: Follow up in 3 weeks, call clinic for follow up    Discharge Instructions:  Patient to follow discharge instructions as provided at the time of discharge.      Dava Najjar, MD, MD  05/16/2022 10:02 AM

## 2022-05-16 NOTE — Progress Notes (Addendum)
Written and verbal discharge instructions to pt at this time, pt discharged to home, stable, no distress noted, with family. Patient verbalized understanding of instructions and denies questions/concerns. Postpartum depression scale completed with pt, score = 12. SW consult was completed. Pt's prescriptions were sent to her preferred pharmacy at discharge. Pt to follow up with MD in 3 weeks. Patient did not require any vaccines at discharge. Pt to vehicle via wheelchair with newborn in arms, no distress noted. Levora Dredge RN

## 2022-05-16 NOTE — Plan of Care (Signed)
Reviewed plan of care. Vitals and lochia WNL. Encouraged skin to skin, ambulation, and use of IS. Notify nurse of needs, questions and concerns. Call light within reach and patient instructed on it's use. Plan of care updated and reviewed with patient.      Problem: Vaginal/Cesarean Delivery  Goal: Postpartum management of pain/discomfort  Outcome: Progressing  Goal: Breasts are soft with nipple integrity intact  Outcome: Progressing  Goal: Gastrointestinal/Urinary management  Outcome: Progressing  Goal: Uterine management  Outcome: Progressing  Goal: Perineum will be clean, dry, and intact and without discharge or hematoma  Outcome: Progressing  Goal: Incision will be clean, dry, and intact and without discharge or hematoma  Outcome: Progressing  Goal: VTE Prevention  Outcome: Progressing  Goal: Evidence of positive mother-baby interactions  Outcome: Progressing

## 2022-05-16 NOTE — Lactation Note (Signed)
0818:  Present at mother's bedside to follow-up with breastfeeding dyad.  Patient, Kim Howe, has continued to both breast and formula feed her newborn.  Mother shared that her breasts, especially the right, are beginning to feel warm, heavy, and tender.  Reviewed strategies  to manage discomfort of increasing breastmilk supply associated with lactogenesis II. Mother verbalized understanding.     Patient states she attempted to breastfeed her newborn this morning, but found it difficult to latch infant him.  Parents chose to supplement infant with formula at @ 0700.  Encouraged mother to call for breastfeeding assistance prior to hospital discharge if desired.  Discussed the importance of milk removal to protect and promote breastmilk supply.  Offer to set mother up with double electric breast pump, but she prefers to wait at this time.    Parents aware of LC assistance while hospitalized.  LC mobile number provided and mother's whiteboard.  Mother denies further questions or concerns at this time.    Murriel Hopper, RNC, IBCLC

## 2022-05-16 NOTE — Plan of Care (Signed)
Problem: Vaginal/Cesarean Delivery  Goal: Postpartum management of pain/discomfort  Outcome: Progressing  Goal: Breasts are soft with nipple integrity intact  Outcome: Progressing  Goal: Gastrointestinal/Urinary management  Outcome: Progressing  Goal: Uterine management  Outcome: Progressing  Goal: Perineum will be clean, dry, and intact and without discharge or hematoma  Outcome: Progressing  Goal: Incision will be clean, dry, and intact and without discharge or hematoma  Outcome: Progressing  Goal: VTE Prevention  Outcome: Progressing  Goal: Evidence of positive mother-baby interactions  Outcome: Progressing

## 2022-05-16 NOTE — Progress Notes (Signed)
Postoperative Day 4 C-section      Subjective:     Patient doing well. Complains of still struggling with breastfeeding.   Patient is bottlefeeding. Patient notes her bleeding to be moderate.    Objective:     Vitals:    05/15/22 0725 05/15/22 1434 05/15/22 2349 05/16/22 0657   BP: 105/68 103/65 101/62 117/73   Pulse: 72 80 84 86   Resp: 16 18 17 17    Temp: 99.7 F (37.6 C) 98.8 F (37.1 C) 98.6 F (37 C) 98.8 F (37.1 C)   TempSrc: Temporal Temporal Temporal Temporal   SpO2: 99%      Weight:       Height:          No intake or output data in the 24 hours ending 05/16/22 5784      Physical Exam:     No acute distress  Chest--CTA bilaterally, normal work of breathing  Cardiovascular--regular rhythm, normal rate  Fundus--firm NT at Umbilicus -1  Incision--clean, dry, and intact  Ext--NT, no cyanois, clubbing or edema  Lochia--decreasing      Lab Results   Component Value Date    HGB 7.2 (L) 05/14/2022    HCT 22.2 (L) 05/14/2022        Assessment/Plan:     Impression:  POD 4 s/p Primary low transverse cesarean section .    Patient is doing well and meeting postpartum milestones.    Plan:  Routine PP care, anticipate D/C home today      Circumcision desired: Yes    Birth control: undecided    Dava Najjar, MD  05/16/2022 9:21 AM

## 2022-05-16 NOTE — Plan of Care (Signed)
Problem: Vaginal/Cesarean Delivery  Goal: Postpartum management of pain/discomfort  Outcome: Completed  Goal: Breasts are soft with nipple integrity intact  Outcome: Completed  Goal: Gastrointestinal/Urinary management  Outcome: Completed  Goal: Uterine management  Outcome: Completed  Goal: Perineum will be clean, dry, and intact and without discharge or hematoma  Outcome: Completed  Goal: Incision will be clean, dry, and intact and without discharge or hematoma  Outcome: Completed  Goal: VTE Prevention  Outcome: Completed  Goal: Evidence of positive mother-baby interactions  Outcome: Completed

## 2022-05-29 NOTE — Addendum Note (Signed)
Addendum  created 05/29/22 2026 by Latina Craver, DO    Intraprocedure Event edited, Intraprocedure Staff edited

## 2022-07-07 ENCOUNTER — Encounter (INDEPENDENT_AMBULATORY_CARE_PROVIDER_SITE_OTHER): Payer: Self-pay

## 2022-07-08 ENCOUNTER — Encounter (INDEPENDENT_AMBULATORY_CARE_PROVIDER_SITE_OTHER): Payer: Self-pay

## 2022-07-08 ENCOUNTER — Ambulatory Visit (INDEPENDENT_AMBULATORY_CARE_PROVIDER_SITE_OTHER): Payer: BC Managed Care – PPO

## 2022-07-08 VITALS — BP 118/60 | HR 65 | Temp 97.5°F | Wt 173.0 lb

## 2022-07-08 DIAGNOSIS — R519 Headache, unspecified: Secondary | ICD-10-CM

## 2022-07-08 DIAGNOSIS — G932 Benign intracranial hypertension: Secondary | ICD-10-CM

## 2022-07-08 DIAGNOSIS — H539 Unspecified visual disturbance: Secondary | ICD-10-CM

## 2022-07-08 NOTE — Progress Notes (Signed)
Name: Kim Howe,Kim Howe    Age: 21 y.o.  DOB: 01/28/2001   MRN: [id number]  Attending: Nikki Domharbel S Fawaz, MD      Date:  July 08, 2022     Reason for Visit / Chief Complaint                                                                   Headache (Follow up//Headaches and vision issues//Most recent image, CT- Head, 05/13/22, [facility])        Narrative Assessment and Plan     Kim Howe is a 21 y.o. female past medical history of benign intracranial hypertension status post VP shunt in 2020, major depressive disorder, generalized anxiety disorder who is status post cesarean section on 05/13/22. During cesarean section, per operative note, multiple loops of VP shunt tubing seen with both tips in the peritoneal cavity.  It was removed and placed in a specimen bag.    Patient has history of right parietal-occipital programmable shunt VP. Patient states her predominant symptoms prior to shunt placement was visual changes/loss.  Patient was seen by our service on consultation on 05/14/2022 with CT scan/XR shunt series demonstrating the intracranial component and valve intact.  Distal to the valve, shunt disconnected.    Patient presented today for outpatient follow-up endorsing roughly 1 week after birth of her child that she began to experience "throbbing" headaches as well as vision issues such as blurred vision and stating that she experienced an episode in which her "vision went black." Denies progressive features.    On exam today patient denies any nausea, vomiting, abdominal pain, headache, visual changes including blurred vision double vision.  No neurological deficits or sensory changes. Neurologic exam was reassuring and without any focal deficits or sensory changes. No weight gain. 12 lb weight loss since June 2023.     Recommended referral to facility with a multidisciplinary team with expertise in cerebral fluid disorder/pseudotumor cerebri for further evaluation and management.  We [facility]  Patient agreed and provided signature on the referral form required from the website.    Focused Spine History       PMH:  has a past medical history of Anxiety, Depression, Intracranial hypertension, and PCOS (polycystic ovarian syndrome).     PSH:  has a past surgical history that includes Brain surgery and CESAREAN SECTION (N/A, 05/13/2022).     Meds: has a current medication list which includes the following prescription(s): acetaminophen, ibuprofen, and sertraline.     BMI: Body mass index is 30.65 kg/m.     Smoking Status:   reports that she has been smoking cigarettes. She has a 1.20 pack-year smoking history. She has never used smokeless tobacco.      Objective     Imaging: Any brain or spine imaging referenced in this note has been personally interpreted as detailed in the narrative sections    GEN: NAD, pleasant, cooperative  CVS: RRR, no carotid bruit  CHEST: No signs of  distress, on room air  ABD: Non-distended   NEURO:     MENTAL STATUS: AAOx3    LANG/SPEECH: Fluent, Intact comprehension    CRANIAL NERVES:    II: Pupils equal and reactive, no RAPD,  normal visual field    III, IV, VI: EOM intact, no gaze preference or deviation    V: normal    VII: no facial asymmetry    VIII: normal hearing to speech    MOTOR: 5/5 in both upper and lower extremities    REFLEXES: 2/4 throughout. No clonus. No hoffman's     SENSORY: Normal to light tough     COORD: No tremor, no dysmetria     Orders Placed Today and Follow Up     1. Pseudotumor cerebri    2. Generalized headaches    3. Visual changes       Medication Orders:   None       Follow Up:  Return PRN.     ____________________________________________________________    Signed by: Sharlene Dory, NP  [facility]

## 2022-07-15 ENCOUNTER — Telehealth: Payer: Self-pay

## 2022-07-15 NOTE — Telephone Encounter (Signed)
This liaison placed a follow-up call to patient per this third referral from her OB / GYN provider.   Information on perinatal behavioral health services was provided to patient.  Patient consented to receive perinatal behavioral health services and to receive referral information in unsecured emails using her first name only in the email.  She confirmed her identity by providing her DOB, home address, and phone number as verified with her OB / GYN provider practice.     Patient shared that  she has the information from our last conversation in January.  She shared that it is difficult to find the time and money to cover the cost of counseling.  Discussed online option for counseling with evening and weekend appointments and the [employer] that can help cover the cost.  Patient shared that she was open to receiving information on these services.  She denied any other concerns at this time.  Patient's OB / GYN provider increased her Zoloft to 100 mg each day and started her on Vistaril 25 mg.  Patient shared that she is not having thoughts to harm herself or others.  She verbally committed to safety, to talking with her supports, and to going to the nearest ED if she did have these thoughts.       In discussing these concerns, we agreed that she would benefit from a referral for counseling and medication management. This liaison sent referral information for [facility]  to patient in an email.  The following was also sent to patient:  Postpartum Support online support group and warm line number, the National Maternal Mental Health Hotline number, postpartum depression and anxiety information, the [employer], and local concern line numbers.     Patient agreed to contact this liaison with any mental health referral questions or concerns.  In total, 5  minutes of personal time was spent with patient.
